# Patient Record
Sex: Male | Born: 1965
Health system: Southern US, Community
[De-identification: ages and names within clinical notes are randomized; demographics above are authoritative.]

## PROBLEM LIST (undated history)

## (undated) ENCOUNTER — Ambulatory Visit (HOSPITAL_COMMUNITY): Admission: EM | Disposition: A | Discharge status: Other Acute Inpatient Hospital | Payer: 59

## (undated) DIAGNOSIS — G2581 Restless legs syndrome: Secondary | ICD-10-CM

## (undated) DIAGNOSIS — K219 Gastro-esophageal reflux disease without esophagitis: Secondary | ICD-10-CM

## (undated) DIAGNOSIS — T7840XA Allergy, unspecified, initial encounter: Secondary | ICD-10-CM

## (undated) HISTORY — DX: Restless legs syndrome: G25.81

## (undated) HISTORY — PX: KNEE ARTHROSCOPY: SUR90

## (undated) HISTORY — PX: KNEE SURGERY: SHX244

## (undated) HISTORY — DX: Allergy, unspecified, initial encounter: T78.40XA

## (undated) HISTORY — DX: Gastro-esophageal reflux disease without esophagitis: K21.9

---

## 2013-12-30 ENCOUNTER — Ambulatory Visit: Payer: Self-pay | Admitting: Nurse Practitioner

## 2014-03-25 ENCOUNTER — Encounter (HOSPITAL_COMMUNITY): Payer: Self-pay | Admitting: Emergency Medicine

## 2014-03-25 ENCOUNTER — Emergency Department (HOSPITAL_COMMUNITY)
Admission: EM | Admit: 2014-03-25 | Discharge: 2014-03-25 | Disposition: A | Discharge status: Home / Self Care | Payer: 59 | Source: Home / Self Care | Attending: Emergency Medicine | Admitting: Emergency Medicine

## 2014-03-25 DIAGNOSIS — J0140 Acute pansinusitis, unspecified: Secondary | ICD-10-CM

## 2014-03-25 MED ORDER — AZITHROMYCIN 250 MG PO TABS: ORAL_TABLET | ORAL | Status: DC

## 2014-03-25 NOTE — ED Provider Notes (Signed)
CSN: [id number]     Arrival date & time 03/25/14  1129 History   First MD Initiated Contact with Patient 03/25/14 1142     Chief Complaint  Patient presents with  . Facial Pain   (Consider location/radiation/quality/duration/timing/severity/associated sxs/prior Treatment) HPI He is a 48 year old man here for evaluation of sinus congestion. He reports sinus congestion, rhinorrhea, postnasal drip for the last 2-3 weeks. It has been gradually worsening. He does report some subjective fevers and chills. No nausea or vomiting. He is eating and drinking well. He has a mild cough that is occasionally productive of green sputum. He also reports a stuffy feeling in his ears. He has tried multiple over-the-counter medications with no improvement.  This started after driving here from [place].  History reviewed. No pertinent past medical history. Past Surgical History  Procedure Laterality Date  . Knee surgery     History reviewed. No pertinent family history. History  Substance Use Topics  . Smoking status: Current Every Day Smoker -- 0.50 packs/day    Types: Cigarettes  . Smokeless tobacco: Not on file  . Alcohol Use: Yes    Review of Systems  Constitutional: Positive for fever (subjective) and chills. Negative for activity change and appetite change.  HENT: Positive for congestion, ear pain, postnasal drip, rhinorrhea and sinus pressure. Negative for sore throat and trouble swallowing.   Respiratory: Positive for cough. Negative for shortness of breath.   Cardiovascular: Negative.   Gastrointestinal: Negative.     Allergies  Penicillins  Home Medications   Prior to Admission medications   Medication Sig Start Date End Date Taking? Authorizing Provider  cyclobenzaprine (FLEXERIL) 10 MG tablet Take 10 mg by mouth 3 (three) times daily as needed for muscle spasms.   Yes Historical Provider, MD  traMADol (ULTRAM) 50 MG tablet Take by mouth every 6 (six) hours as needed.   Yes Historical  Provider, MD  azithromycin (ZITHROMAX Z-PAK) 250 MG tablet Take 2 tablets on day 1, then 1 pill daily until gone. 03/25/14   Melony Overly, MD   BP 127/91  Pulse 96  Temp(Src) 98.7 F (37.1 C) (Oral)  Resp 16  SpO2 98% Physical Exam  Constitutional: He is oriented to person, place, and time. He appears well-developed and well-nourished. No distress.  HENT:  Head: Normocephalic and atraumatic.  Right Ear: Tympanic membrane and external ear normal.  Left Ear: Tympanic membrane and external ear normal.  Nose: Mucosal edema and rhinorrhea present. Right sinus exhibits maxillary sinus tenderness and frontal sinus tenderness. Left sinus exhibits maxillary sinus tenderness and frontal sinus tenderness.  Mouth/Throat: Mucous membranes are normal. No oropharyngeal exudate, posterior oropharyngeal edema or posterior oropharyngeal erythema.  Eyes: Conjunctivae are normal.  Neck: Neck supple.  Cardiovascular: Normal rate, regular rhythm and normal heart sounds.   No murmur heard. Pulmonary/Chest: Effort normal and breath sounds normal. No respiratory distress. He has no wheezes. He has no rales.  Lymphadenopathy:    He has no cervical adenopathy.  Neurological: He is alert and oriented to person, place, and time.  Skin: Skin is warm and dry. No rash noted.    ED Course  Procedures (including critical care time) Labs Review Labs Reviewed - No data to display  Imaging Review No results found.   MDM   1. Acute pansinusitis, recurrence not specified    History and exam consistent with pansinusitis. With the timing of history to [place], could consider histoplasmosis as an etiology, but his presentation would be unusual. Will treat  with azithromycin given that he is allergic to penicillin. Also recommended Afrin for 3 days and frequent nasal saline spray. Followup with primary care provider if not improved after antibiotics.    Melony Overly, MD 03/25/14 [phone]

## 2014-03-25 NOTE — Discharge Instructions (Signed)
Take the antibiotic as prescribed. Use Afrin nasal spray for the next 3 days ONLY!! Use nasal saline spray as often as you can. If your symptoms do not improve in the next 5 days, please see your regular doctor.   Sinusitis Sinusitis is redness, soreness, and puffiness (inflammation) of the air pockets in the bones of your face (sinuses). The redness, soreness, and puffiness can cause air and mucus to get trapped in your sinuses. This can allow germs to grow and cause an infection.  HOME CARE   Drink enough fluids to keep your pee (urine) clear or pale yellow.  Use a humidifier in your home.  Run a hot shower to create steam in the bathroom. Sit in the bathroom with the door closed. Breathe in the steam 3-4 times a day.  Put a warm, moist washcloth on your face 3-4 times a day, or as told by your doctor.  Use salt water sprays (saline sprays) to wet the thick fluid in your nose. This can help the sinuses drain.  Only take medicine as told by your doctor. GET HELP RIGHT AWAY IF:   Your pain gets worse.  You have very bad headaches.  You are sick to your stomach (nauseous).  You throw up (vomit).  You are very sleepy (drowsy) all the time.  Your face is puffy (swollen).  Your vision changes.  You have a stiff neck.  You have trouble breathing. MAKE SURE YOU:   Understand these instructions.  Will watch your condition.  Will get help right away if you are not doing well or get worse. Document Released: 11/19/2007 Document Revised: 02/25/2012 Document Reviewed: 01/06/2012 [facility] Patient Information 2015 [place], [place]. This information is not intended to replace advice given to you by your health care provider. Make sure you discuss any questions you have with your health care provider.

## 2014-03-25 NOTE — ED Notes (Signed)
C/o  Congestion.  Productive cough with light green sputum.  Low grade temp.  Runny nose.  Sneezing.  And scratchy throat.  Symptoms present x 2 wks.  No relief with otc meds.

## 2015-03-16 ENCOUNTER — Emergency Department (HOSPITAL_COMMUNITY)
Admission: EM | Admit: 2015-03-16 | Discharge: 2015-03-16 | Disposition: A | Discharge status: Home / Self Care | Payer: 59 | Source: Home / Self Care | Attending: Family Medicine | Admitting: Family Medicine

## 2015-03-16 ENCOUNTER — Encounter (HOSPITAL_COMMUNITY): Payer: Self-pay | Admitting: Emergency Medicine

## 2015-03-16 DIAGNOSIS — J302 Other seasonal allergic rhinitis: Secondary | ICD-10-CM

## 2015-03-16 NOTE — Discharge Instructions (Signed)
Allergic Rhinitis vs URI Obtain Claritin-D or Allegra-D over-the-counter for congestion and drainage. For cough may consider Robitussin-DM. Tylenol or Advil for discomfort. Flonase or Rhinocort nasal spray as directed these are over-the-counter. Drink lots of fluids and stay well-hydrated. Allergic rhinitis is when the mucous membranes in the nose respond to allergens. Allergens are particles in the air that cause your body to have an allergic reaction. This causes you to release allergic antibodies. Through a chain of events, these eventually cause you to release histamine into the blood stream. Although meant to protect the body, it is this release of histamine that causes your discomfort, such as frequent sneezing, congestion, and an itchy, runny nose.  CAUSES  Seasonal allergic rhinitis (hay fever) is caused by pollen allergens that may come from grasses, trees, and weeds. Year-round allergic rhinitis (perennial allergic rhinitis) is caused by allergens such as house dust mites, pet dander, and mold spores.  SYMPTOMS   Nasal stuffiness (congestion).  Itchy, runny nose with sneezing and tearing of the eyes. DIAGNOSIS  Your health care provider can help you determine the allergen or allergens that trigger your symptoms. If you and your health care provider are unable to determine the allergen, skin or blood testing may be used. TREATMENT  Allergic rhinitis does not have a cure, but it can be controlled by:  Medicines and allergy shots (immunotherapy).  Avoiding the allergen. Hay fever may often be treated with antihistamines in pill or nasal spray forms. Antihistamines block the effects of histamine. There are over-the-counter medicines that may help with nasal congestion and swelling around the eyes. Check with your health care provider before taking or giving this medicine.  If avoiding the allergen or the medicine prescribed do not work, there are many new medicines your health care  provider can prescribe. Stronger medicine may be used if initial measures are ineffective. Desensitizing injections can be used if medicine and avoidance does not work. Desensitization is when a patient is given ongoing shots until the body becomes less sensitive to the allergen. Make sure you follow up with your health care provider if problems continue. HOME CARE INSTRUCTIONS It is not possible to completely avoid allergens, but you can reduce your symptoms by taking steps to limit your exposure to them. It helps to know exactly what you are allergic to so that you can avoid your specific triggers. SEEK MEDICAL CARE IF:   You have a fever.  You develop a cough that does not stop easily (persistent).  You have shortness of breath.  You start wheezing.  Symptoms interfere with normal daily activities. Document Released: 02/25/2001 Document Revised: 06/07/2013 Document Reviewed: 02/07/2013 [facility] Patient Information 2015 [place], [place]. This information is not intended to replace advice given to you by your health care provider. Make sure you discuss any questions you have with your health care provider.  Upper Respiratory Infection, Adult An upper respiratory infection (URI) is also known as the common cold. It is often caused by a type of germ (virus). Colds are easily spread (contagious). You can pass it to others by kissing, coughing, sneezing, or drinking out of the same glass. Usually, you get better in 1 or 2 weeks.  HOME CARE   Only take medicine as told by your doctor.  Use a warm mist humidifier or breathe in steam from a hot shower.  Drink enough water and fluids to keep your pee (urine) clear or pale yellow.  Get plenty of rest.  Return to work when your  temperature is back to normal or as told by your doctor. You may use a face mask and wash your hands to stop your cold from spreading. GET HELP RIGHT AWAY IF:   After the first few days, you feel you are getting  worse.  You have questions about your medicine.  You have chills, shortness of breath, or brown or red spit (mucus).  You have yellow or brown snot (nasal discharge) or pain in the face, especially when you bend forward.  You have a fever, puffy (swollen) neck, pain when you swallow, or white spots in the back of your throat.  You have a bad headache, ear pain, sinus pain, or chest pain.  You have a high-pitched whistling sound when you breathe in and out (wheezing).  You have a lasting cough or cough up blood.  You have sore muscles or a stiff neck. MAKE SURE YOU:   Understand these instructions.  Will watch your condition.  Will get help right away if you are not doing well or get worse. Document Released: 11/19/2007 Document Revised: 08/25/2011 Document Reviewed: 09/07/2013 [facility] Patient Information 2015 [place], [place]. This information is not intended to replace advice given to you by your health care provider. Make sure you discuss any questions you have with your health care provider.

## 2015-03-16 NOTE — ED Notes (Signed)
C/o cold sx onset Monday Sx include facial pressure, fevers, BA, HA, runny nose, and a dry cough Denies fevers, chills Son is being seen for similar sx A&O x4... No acute distress.

## 2015-03-16 NOTE — ED Provider Notes (Signed)
CSN: [id number]     Arrival date & time 03/16/15  1637 History   First MD Initiated Contact with Patient 03/16/15 1712     Chief Complaint  Patient presents with  . URI   (Consider location/radiation/quality/duration/timing/severity/associated sxs/prior Treatment) HPI Comments: 49 year old male with a history of allergy states that he is having body aches, runny nose particularly in the morning, intermittent headaches, and feeling hot and sweaty. Denies measured fevers. States he feels similar to what his allergies feel like on a yearly basis.   History reviewed. No pertinent past medical history. Past Surgical History  Procedure Laterality Date  . Knee surgery     No family history on file. Social History  Substance Use Topics  . Smoking status: Current Every Day Smoker -- 0.50 packs/day    Types: Cigarettes  . Smokeless tobacco: None  . Alcohol Use: Yes    Review of Systems  Constitutional: Positive for activity change. Negative for fever, diaphoresis and fatigue.  HENT: Positive for congestion, postnasal drip and rhinorrhea. Negative for ear pain, facial swelling, sore throat and trouble swallowing.   Eyes: Negative for pain, discharge and redness.  Respiratory: Positive for cough. Negative for chest tightness and shortness of breath.   Cardiovascular: Negative.   Gastrointestinal: Negative.   Musculoskeletal: Negative.  Negative for neck pain and neck stiffness.  Neurological: Negative.   All other systems reviewed and are negative.   Allergies  Penicillins  Home Medications   Prior to Admission medications   Medication Sig Start Date End Date Taking? Authorizing Provider  cyclobenzaprine (FLEXERIL) 10 MG tablet Take 10 mg by mouth 3 (three) times daily as needed for muscle spasms.   Yes Historical Provider, MD  traMADol (ULTRAM) 50 MG tablet Take by mouth every 6 (six) hours as needed.   Yes Historical Provider, MD  azithromycin (ZITHROMAX Z-PAK) 250 MG tablet Take  2 tablets on day 1, then 1 pill daily until gone. 03/25/14   Melony Overly, MD   Meds Ordered and Administered this Visit  Medications - No data to display  BP 105/61 mmHg  Pulse 81  Temp(Src) 97.1 F (36.2 C) (Oral)  Resp 16  SpO2 98% No data found.   Physical Exam  Constitutional: He is oriented to person, place, and time. He appears well-developed and well-nourished. No distress.  HENT:  Mouth/Throat: No oropharyngeal exudate.  Bilateral TMs are translucent. No erythema or effusion. Positive for bilateral mild TM retraction. Oropharynx with minor erythema, cobblestoning and clear PND.  Eyes: Conjunctivae and EOM are normal.  Neck: Normal range of motion. Neck supple.  Cardiovascular: Normal rate, regular rhythm and normal heart sounds.   Pulmonary/Chest: Effort normal. No respiratory distress. He has no wheezes.  No expiratory wheezing. There are diminished breath sounds in the lower fields bilaterally. Prolonged expiratory phase. No  adventitious sounds.  Musculoskeletal: Normal range of motion. He exhibits no edema.  Lymphadenopathy:    He has no cervical adenopathy.  Neurological: He is alert and oriented to person, place, and time.  Skin: Skin is warm and dry. No rash noted.  Psychiatric: He has a normal mood and affect.  Nursing note and vitals reviewed.   ED Course  Procedures (including critical care time)  Labs Review Labs Reviewed - No data to display  Imaging Review No results found.   Visual Acuity Review  Right Eye Distance:   Left Eye Distance:   Bilateral Distance:    Right Eye Near:   Left Eye Near:  Bilateral Near:         MDM   1. Other seasonal allergic rhinitis   vs URI  Obtain Claritin-D or Allegra-D over-the-counter for congestion and drainage. For cough may consider Robitussin-DM. Tylenol or Advil for discomfort. Flonase or Rhinocort nasal spray as directed these are over-the-counter. Drink lots of fluids and stay  well-hydrated.      Janne Napoleon, NP 03/16/15 1803  Janne Napoleon, NP 03/16/15 [phone]

## 2015-06-21 DIAGNOSIS — E663 Overweight: Secondary | ICD-10-CM | POA: Diagnosis not present

## 2015-06-21 DIAGNOSIS — Z0000 Encounter for general adult medical examination without abnormal findings: Secondary | ICD-10-CM | POA: Diagnosis not present

## 2015-06-21 DIAGNOSIS — Z6829 Body mass index (BMI) 29.0-29.9, adult: Secondary | ICD-10-CM | POA: Diagnosis not present

## 2015-06-21 DIAGNOSIS — M1991 Primary osteoarthritis, unspecified site: Secondary | ICD-10-CM | POA: Diagnosis not present

## 2015-06-21 DIAGNOSIS — Z1389 Encounter for screening for other disorder: Secondary | ICD-10-CM | POA: Diagnosis not present

## 2015-12-19 DIAGNOSIS — Z79891 Long term (current) use of opiate analgesic: Secondary | ICD-10-CM | POA: Diagnosis not present

## 2015-12-19 DIAGNOSIS — G894 Chronic pain syndrome: Secondary | ICD-10-CM | POA: Diagnosis not present

## 2015-12-19 DIAGNOSIS — Z1389 Encounter for screening for other disorder: Secondary | ICD-10-CM | POA: Diagnosis not present

## 2015-12-19 DIAGNOSIS — M1991 Primary osteoarthritis, unspecified site: Secondary | ICD-10-CM | POA: Diagnosis not present

## 2015-12-19 DIAGNOSIS — Z6828 Body mass index (BMI) 28.0-28.9, adult: Secondary | ICD-10-CM | POA: Diagnosis not present

## 2016-06-25 ENCOUNTER — Other Ambulatory Visit: Payer: Self-pay | Admitting: Occupational Medicine

## 2016-06-25 ENCOUNTER — Ambulatory Visit: Payer: Self-pay

## 2016-06-25 DIAGNOSIS — M25562 Pain in left knee: Secondary | ICD-10-CM

## 2016-06-30 DIAGNOSIS — Z6832 Body mass index (BMI) 32.0-32.9, adult: Secondary | ICD-10-CM | POA: Diagnosis not present

## 2016-06-30 DIAGNOSIS — E6609 Other obesity due to excess calories: Secondary | ICD-10-CM | POA: Diagnosis not present

## 2016-06-30 DIAGNOSIS — G894 Chronic pain syndrome: Secondary | ICD-10-CM | POA: Diagnosis not present

## 2016-06-30 DIAGNOSIS — Z1389 Encounter for screening for other disorder: Secondary | ICD-10-CM | POA: Diagnosis not present

## 2016-07-16 MED FILL — metroNIDAZOLE 500 MG TABS: 500 | 1 days supply | Qty: 4 | Fill #0

## 2016-08-08 DIAGNOSIS — M9903 Segmental and somatic dysfunction of lumbar region: Secondary | ICD-10-CM | POA: Diagnosis not present

## 2016-08-08 DIAGNOSIS — M545 Low back pain: Secondary | ICD-10-CM | POA: Diagnosis not present

## 2016-08-08 DIAGNOSIS — M9902 Segmental and somatic dysfunction of thoracic region: Secondary | ICD-10-CM | POA: Diagnosis not present

## 2016-08-08 DIAGNOSIS — M9905 Segmental and somatic dysfunction of pelvic region: Secondary | ICD-10-CM | POA: Diagnosis not present

## 2016-08-22 DIAGNOSIS — M545 Low back pain: Secondary | ICD-10-CM | POA: Diagnosis not present

## 2016-08-22 DIAGNOSIS — M9903 Segmental and somatic dysfunction of lumbar region: Secondary | ICD-10-CM | POA: Diagnosis not present

## 2016-08-22 DIAGNOSIS — M9902 Segmental and somatic dysfunction of thoracic region: Secondary | ICD-10-CM | POA: Diagnosis not present

## 2016-08-22 DIAGNOSIS — M9905 Segmental and somatic dysfunction of pelvic region: Secondary | ICD-10-CM | POA: Diagnosis not present

## 2016-09-16 MED FILL — DOXYCYCLINE HYCLATE 100 MG: 100 | 5 days supply | Qty: 10 | Fill #0

## 2016-09-16 MED FILL — HYDROCODON-APAP 5-325: 5-325 | 5 days supply | Qty: 60 | Fill #0

## 2016-09-26 DIAGNOSIS — G894 Chronic pain syndrome: Secondary | ICD-10-CM | POA: Diagnosis not present

## 2016-09-26 DIAGNOSIS — E6609 Other obesity due to excess calories: Secondary | ICD-10-CM | POA: Diagnosis not present

## 2016-09-26 DIAGNOSIS — Z6830 Body mass index (BMI) 30.0-30.9, adult: Secondary | ICD-10-CM | POA: Diagnosis not present

## 2016-09-26 DIAGNOSIS — Z1389 Encounter for screening for other disorder: Secondary | ICD-10-CM | POA: Diagnosis not present

## 2016-09-26 DIAGNOSIS — E782 Mixed hyperlipidemia: Secondary | ICD-10-CM | POA: Diagnosis not present

## 2016-12-04 DIAGNOSIS — Z113 Encounter for screening for infections with a predominantly sexual mode of transmission: Secondary | ICD-10-CM | POA: Diagnosis not present

## 2016-12-04 DIAGNOSIS — Z6831 Body mass index (BMI) 31.0-31.9, adult: Secondary | ICD-10-CM | POA: Diagnosis not present

## 2016-12-04 DIAGNOSIS — Z1389 Encounter for screening for other disorder: Secondary | ICD-10-CM | POA: Diagnosis not present

## 2016-12-04 DIAGNOSIS — M1711 Unilateral primary osteoarthritis, right knee: Secondary | ICD-10-CM | POA: Diagnosis not present

## 2017-01-24 ENCOUNTER — Ambulatory Visit (INDEPENDENT_AMBULATORY_CARE_PROVIDER_SITE_OTHER): Payer: 59 | Admitting: Urgent Care

## 2017-01-24 ENCOUNTER — Encounter: Payer: Self-pay | Admitting: Urgent Care

## 2017-01-24 VITALS — BP 110/80 | HR 81 | Temp 97.6°F | Resp 16 | Ht 69.5 in | Wt 217.6 lb

## 2017-01-24 DIAGNOSIS — Z9889 Other specified postprocedural states: Secondary | ICD-10-CM | POA: Diagnosis not present

## 2017-01-24 DIAGNOSIS — F17200 Nicotine dependence, unspecified, uncomplicated: Secondary | ICD-10-CM

## 2017-01-24 DIAGNOSIS — G2581 Restless legs syndrome: Secondary | ICD-10-CM

## 2017-01-24 MED ORDER — CYCLOBENZAPRINE HCL 10 MG PO TABS: 10.0000 mg | ORAL_TABLET | Freq: Every day | ORAL | 5 refills | Status: DC

## 2017-01-24 NOTE — Patient Instructions (Addendum)
Restless Legs Syndrome Restless legs syndrome is a condition that causes uncomfortable feelings or sensations in the legs, especially while sitting or lying down. The sensations usually cause an overwhelming urge to move the legs. The arms can also sometimes be affected. The condition can range from mild to severe. The symptoms often interfere with a person's ability to sleep. What are the causes? The cause of this condition is not known. What increases the risk? This condition is more likely to develop in:  People who are older than age 10.  Pregnant women. In general, restless legs syndrome is more common in women than in men.  People who have a family history of the condition.  People who have certain medical conditions, such as iron deficiency, kidney disease, Parkinson disease, or nerve damage.  People who take certain medicines, such as medicines for high blood pressure, nausea, colds, allergies, depression, and some heart conditions.  What are the signs or symptoms? The main symptom of this condition is uncomfortable sensations in the legs. These sensations may be:  Described as pulling, tingling, prickling, throbbing, crawling, or burning.  Worse while you are sitting or lying down.  Worse during periods of rest or inactivity.  Worse at night, often interfering with your sleep.  Accompanied by a very strong urge to move your legs.  Temporarily relieved by movement of your legs.  The sensations usually affect both sides of the body. The arms can also be affected, but this is rare. People who have this condition often have tiredness during the day because of their lack of sleep at night. How is this diagnosed? This condition may be diagnosed based on your description of the symptoms. You may also have tests, including blood tests, to check for other conditions that may lead to your symptoms. In some cases, you may be asked to spend some time in a sleep lab so your sleeping  can be monitored. How is this treated? Treatment for this condition is focused on managing the symptoms. Treatment may include:  Self-help and lifestyle changes.  Medicines.  Follow these instructions at home:  Take medicines only as directed by your health care provider.  Try these methods to get temporary relief from the uncomfortable sensations: ? Massage your legs. ? Walk or stretch. ? Take a cold or hot bath.  Practice good sleep habits. For example, go to bed and get up at the same time every day.  Exercise regularly.  Practice ways of relaxing, such as yoga or meditation.  Avoid caffeine and alcohol.  Do not use any tobacco products, including cigarettes, chewing tobacco, or electronic cigarettes. If you need help quitting, ask your health care provider.  Keep all follow-up visits as directed by your health care provider. This is important. Contact a health care provider if: Your symptoms do not improve with treatment, or they get worse. This information is not intended to replace advice given to you by your health care provider. Make sure you discuss any questions you have with your health care provider. Document Released: 05/23/2002 Document Revised: 11/08/2015 Document Reviewed: 05/29/2014 Elsevier Interactive Patient Education  2018 [employer].    Cyclobenzaprine tablets What is this medicine? CYCLOBENZAPRINE (sye kloe BEN za preen) is a muscle relaxer. It is used to treat muscle pain, spasms, and stiffness. This medicine may be used for other purposes; ask your health care provider or pharmacist if you have questions. COMMON BRAND NAME(S): Fexmid, Flexeril What should I tell my health care provider before  I take this medicine? They need to know if you have any of these conditions: -heart disease, irregular heartbeat, or previous heart attack -liver disease -thyroid problem -an unusual or allergic reaction to cyclobenzaprine, tricyclic antidepressants,  lactose, other medicines, foods, dyes, or preservatives -pregnant or trying to get pregnant -breast-feeding How should I use this medicine? Take this medicine by mouth with a glass of water. Follow the directions on the prescription label. If this medicine upsets your stomach, take it with food or milk. Take your medicine at regular intervals. Do not take it more often than directed. Talk to your pediatrician regarding the use of this medicine in children. Special care may be needed. Overdosage: If you think you have taken too much of this medicine contact a poison control center or emergency room at once. NOTE: This medicine is only for you. Do not share this medicine with others. What if I miss a dose? If you miss a dose, take it as soon as you can. If it is almost time for your next dose, take only that dose. Do not take double or extra doses. What may interact with this medicine? Do not take this medicine with any of the following medications: -certain medicines for fungal infections like fluconazole, itraconazole, ketoconazole, posaconazole, voriconazole -cisapride -dofetilide -dronedarone -halofantrine -levomethadyl -MAOIs like Carbex, Eldepryl, Marplan, Nardil, and Parnate -narcotic medicines for cough -pimozide -thioridazine -ziprasidone This medicine may also interact with the following medications: -alcohol -antihistamines for allergy, cough and cold -certain medicines for anxiety or sleep -certain medicines for cancer -certain medicines for depression like amitriptyline, fluoxetine, sertraline -certain medicines for infection like alfuzosin, chloroquine, clarithromycin, levofloxacin, mefloquine, pentamidine, troleandomycin -certain medicines for irregular heart beat -certain medicines for seizures like phenobarbital, primidone -contrast dyes -general anesthetics like halothane, isoflurane, methoxyflurane, propofol -local anesthetics like lidocaine, pramoxine,  tetracaine -medicines that relax muscles for surgery -narcotic medicines for pain -other medicines that prolong the QT interval (cause an abnormal heart rhythm) -phenothiazines like chlorpromazine, mesoridazine, prochlorperazine This list may not describe all possible interactions. Give your health care provider a list of all the medicines, herbs, non-prescription drugs, or dietary supplements you use. Also tell them if you smoke, drink alcohol, or use illegal drugs. Some items may interact with your medicine. What should I watch for while using this medicine? Tell your doctor or health care professional if your symptoms do not start to get better or if they get worse. You may get drowsy or dizzy. Do not drive, use machinery, or do anything that needs mental alertness until you know how this medicine affects you. Do not stand or sit up quickly, especially if you are an older patient. This reduces the risk of dizzy or fainting spells. Alcohol may interfere with the effect of this medicine. Avoid alcoholic drinks. If you are taking another medicine that also causes drowsiness, you may have more side effects. Give your health care provider a list of all medicines you use. Your doctor will tell you how much medicine to take. Do not take more medicine than directed. Call emergency for help if you have problems breathing or unusual sleepiness. Your mouth may get dry. Chewing sugarless gum or sucking hard candy, and drinking plenty of water may help. Contact your doctor if the problem does not go away or is severe. What side effects may I notice from receiving this medicine? Side effects that you should report to your doctor or health care professional as soon as possible: -allergic reactions like skin rash, itching  or hives, swelling of the face, lips, or tongue -breathing problems -chest pain -fast, irregular heartbeat -hallucinations -seizures -unusually weak or tired Side effects that usually do not  require medical attention (report to your doctor or health care professional if they continue or are bothersome): -headache -nausea, vomiting This list may not describe all possible side effects. Call your doctor for medical advice about side effects. You may report side effects to FDA at 1-800-FDA-1088. Where should I keep my medicine? Keep out of the reach of children. Store at room temperature between 15 and 30 degrees C (59 and 86 degrees F). Keep container tightly closed. Throw away any unused medicine after the expiration date. NOTE: This sheet is a summary. It may not cover all possible information. If you have questions about this medicine, talk to your doctor, pharmacist, or health care provider.  2018 Elsevier/Gold Standard (2015-03-13 12:05:46)     Health Risks of Smoking Smoking cigarettes is very bad for your health. Tobacco smoke has over 200 known poisons in it. It contains the poisonous gases nitrogen oxide and carbon monoxide. There are over 60 chemicals in tobacco smoke that cause cancer. Smoking is difficult to quit because a chemical in tobacco, called nicotine, causes addiction or dependence. When you smoke and inhale, nicotine is absorbed rapidly into the bloodstream through your lungs. Both inhaled and non-inhaled nicotine may be addictive. What are the risks of cigarette smoke? Cigarette smokers have an increased risk of many serious medical problems, including:  Lung cancer.  Lung disease, such as pneumonia, bronchitis, and emphysema.  Chest pain (angina) and heart attack because the heart is not getting enough oxygen.  Heart disease and peripheral blood vessel disease.  High blood pressure (hypertension).  Stroke.  Oral cancer, including cancer of the lip, mouth, or voice box.  Bladder cancer.  Pancreatic cancer.  Cervical cancer.  Pregnancy complications, including premature birth.  Stillbirths and smaller newborn babies, birth defects, and genetic  damage to sperm.  Early menopause.  Lower estrogen level for women.  Infertility.  Facial wrinkles.  Blindness.  Increased risk of broken bones (fractures).  Senile dementia.  Stomach ulcers and internal bleeding.  Delayed wound healing and increased risk of complications during surgery.  Even smoking lightly shortens your life expectancy by several years.  Because of secondhand smoke exposure, children of smokers have an increased risk of the following:  Sudden infant death syndrome (SIDS).  Respiratory infections.  Lung cancer.  Heart disease.  Ear infections.  What are the benefits of quitting? There are many health benefits of quitting smoking. Here are some of them:  Within days of quitting smoking, your risk of having a heart attack decreases, your blood flow improves, and your lung capacity improves. Blood pressure, pulse rate, and breathing patterns start returning to normal soon after quitting.  Within months, your lungs may clear up completely.  Quitting for 10 years reduces your risk of developing lung cancer and heart disease to almost that of a nonsmoker.  People who quit may see an improvement in their overall quality of life.  How do I quit smoking? Smoking is an addiction with both physical and psychological effects, and longtime habits can be hard to change. Your health care provider can recommend:  Programs and community resources, which may include group support, education, or talk therapy.  Prescription medicines to help reduce cravings.  Nicotine replacement products, such as patches, gum, and nasal sprays. Use these products only as directed. Do not replace cigarette  smoking with electronic cigarettes, which are commonly called e-cigarettes. The safety of e-cigarettes is not known, and some may contain harmful chemicals.  A combination of two or more of these methods.  Where to find more information:  American Lung Association:  www.lung.org  American Cancer Society: www.cancer.org Summary  Smoking cigarettes is very bad for your health. Cigarette smokers have an increased risk of many serious medical problems, including several cancers, heart disease, and stroke.  Smoking is an addiction with both physical and psychological effects, and longtime habits can be hard to change.  By stopping right away, you can greatly reduce the risk of medical problems for you and your family.  To help you quit smoking, your health care provider can recommend programs, community resources, prescription medicines, and nicotine replacement products such as patches, gum, and nasal sprays. This information is not intended to replace advice given to you by your health care provider. Make sure you discuss any questions you have with your health care provider. Document Released: 07/10/2004 Document Revised: 06/06/2016 Document Reviewed: 06/06/2016 Elsevier Interactive Patient Education  2017 [employer].   IF you received an x-ray today, you will receive an invoice from [facility]. Please contact [facility] at [phone] with questions or concerns regarding your invoice.   IF you received labwork today, you will receive an invoice from [facility]. Please contact [facility] at [phone] with questions or concerns regarding your invoice.   Our billing staff will not be able to assist you with questions regarding bills from these companies.  You will be contacted with the lab results as soon as they are available. The fastest way to get your results is to activate your My Chart account. Instructions are located on the last page of this paperwork. If you have not heard from [place] regarding the results in 2 weeks, please contact this office.

## 2017-01-24 NOTE — Progress Notes (Signed)
  MRN: [id number] DOB: 12-Nov-1965  Subjective:   Tyler Mack is a 51 y.o. male presenting for chief complaint of Leg Problem (restless leg syndrome bilateral  ) and Medication Refill (muscle relaxer)  Restless legs - Has a history of multiple surgeries to both knees. Has difficulty with restless legs worse at night, interrupts his sleep. Uses Flexeril for this with very good results. Usually takes 10mg  but on occasion uses 20mg  nightly. Sees Dr. Theda Sers with ortho, has follow up on the 22nd of August. Patient had blood draw done through [facility] about 1 month, did not have any abnormal findings. Smokes 1ppd, has tried to quit twice with nicotine gum, patches. Has also tried Chantix but failed this therapy.  Rhylen has a current medication list which includes the following prescription(s): cetirizine, cyclobenzaprine, and tramadol. Also is allergic to penicillins.  Tyler Mack  has a past medical history of Allergy; GERD (gastroesophageal reflux disease); and Restless legs syndrome. Also  has a past surgical history that includes Knee surgery. His family history includes Alcohol abuse in his brother, maternal aunt, and maternal uncle; Asthma in his son; Cancer in his maternal aunt and maternal uncle; Diabetes in his mother; Drug abuse in his brother; Heart disease in his brother; Hyperlipidemia in his brother; Hypertension in his brother; Mental illness in his brother; Stomach cancer in his father.   Objective:   Vitals: BP 110/80 (BP Location: Left Arm, Patient Position: Sitting, Cuff Size: Large)   Pulse 81   Temp 97.6 F (36.4 C) (Oral)   Resp 16   Ht 5' 9.5" (1.765 m)   Wt 217 lb 9.6 oz (98.7 kg)   SpO2 97%   BMI 31.67 kg/m   Physical Exam  Constitutional: He is oriented to person, place, and time. He appears well-developed and well-nourished.  Cardiovascular: Normal rate.   Pulmonary/Chest: Effort normal.  Neurological: He is alert and oriented to person, place, and time.  Psychiatric: He  has a normal mood and affect.   Assessment and Plan :   1. Restless legs syndrome 2. History of left knee surgery 3. History of right knee surgery - Stable, does very well with Flexeril. Counseled patient on potential for adverse effects with medications prescribed today, patient verbalized understanding. Follow up in 6 months.  4. Tobacco use disorder - Counseled on smoking cessation, patient will consider trying Wellbutrin if he becomes interested in quitting.  Jaynee Eagles, PA-C [facility] [phone] 01/24/2017  9:40 AM

## 2017-02-04 ENCOUNTER — Encounter: Payer: Self-pay | Admitting: Family Medicine

## 2017-02-04 ENCOUNTER — Ambulatory Visit (INDEPENDENT_AMBULATORY_CARE_PROVIDER_SITE_OTHER): Payer: 59 | Admitting: Family Medicine

## 2017-02-04 VITALS — BP 132/82 | Ht 69.5 in | Wt 218.8 lb

## 2017-02-04 DIAGNOSIS — R5383 Other fatigue: Secondary | ICD-10-CM | POA: Diagnosis not present

## 2017-02-04 MED ORDER — METRONIDAZOLE 500 MG PO TABS: 500.0000 mg | ORAL_TABLET | Freq: Two times a day (BID) | ORAL | 0 refills | Status: DC

## 2017-02-04 NOTE — Progress Notes (Signed)
   Subjective:    Patient ID: Tyler Mack, male    DOB: May 01, 1966, 51 y.o.   MRN: [id number] Patient presents as a new patient with substantial fatigue and tiredness over the past couple years. HPI Patient arrives ti discuss testosterone levels. Patient states he is having low energy level-tried coffee  and energy drinks and they do not help so he is wondering if it is related to testosterone level.  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  Smokes ppd  No alcohol itake  occas [place] just on ccasiion   Pt was on cholesterol meds, fam hx of this, hereditary  Reg chol norm al   Gets enough sleep 10 thirty til five thirty  Sleeps well, no hx of major smoring  Walks five mi l per day, stays active   Hx of knee surg, takes flexeril   No colon c a no prost cancer      Review of Systems No headache, no major weight loss or weight gain, no chest pain no back pain abdominal pain no change in bowel habits complete ROS otherwise negative     Objective:   Physical Exam  Alert and oriented, vitals reviewed and stable, NAD ENT-TM's and ext canals WNL bilat via otoscopic exam Soft palate, tonsils and post pharynx WNL via oropharyngeal exam Neck-symmetric, no masses; thyroid nonpalpable and nontender Pulmonary-no tachypnea or accessory muscle use; Clear without wheezes via auscultation Card--no abnrml murmurs, rhythm reg and rate WNL Carotid pulses symmetric, without bruits       Assessment & Plan:  Impression 1 fatigue and tiredness progressive over a couple years. #2 chronic smoker no cough no wheezing no morning cough no tachypnea smoking cessation discussed #3 testosterone concerns. Patient's bodies and family has been tone this is most certainly  initially. Patient educated on many potential side effects become from testosterone initiation and the fact that we need very low numbers before justifying these side effects and risks plan appropriate blood work. Exercise diet discussed in encourage. Smoking cessation discussed. Next further recommendations based on blood work results  Greater than 50% of this 30 minute face to face visit was spent in counseling and discussion and coordination of care regarding the above diagnosis/diagnosies/nearly wellness exams encourage

## 2017-03-12 ENCOUNTER — Ambulatory Visit: Payer: 59 | Admitting: Physician Assistant

## 2017-03-14 ENCOUNTER — Ambulatory Visit: Payer: 59 | Admitting: Family Medicine

## 2017-03-14 ENCOUNTER — Encounter: Payer: Self-pay | Admitting: Urgent Care

## 2017-03-14 ENCOUNTER — Ambulatory Visit (INDEPENDENT_AMBULATORY_CARE_PROVIDER_SITE_OTHER): Payer: 59 | Admitting: Urgent Care

## 2017-03-14 VITALS — BP 130/90 | HR 100 | Temp 98.2°F | Resp 17 | Ht 69.5 in | Wt 217.0 lb

## 2017-03-14 DIAGNOSIS — Z9889 Other specified postprocedural states: Secondary | ICD-10-CM | POA: Diagnosis not present

## 2017-03-14 DIAGNOSIS — M25562 Pain in left knee: Secondary | ICD-10-CM

## 2017-03-14 DIAGNOSIS — G8929 Other chronic pain: Secondary | ICD-10-CM

## 2017-03-14 DIAGNOSIS — Z23 Encounter for immunization: Secondary | ICD-10-CM | POA: Diagnosis not present

## 2017-03-14 MED ORDER — MELOXICAM 7.5 MG PO TABS: 7.5000 mg | ORAL_TABLET | Freq: Every day | ORAL | 5 refills | Status: DC

## 2017-03-14 MED ORDER — TRAMADOL HCL 50 MG PO TABS: 50.0000 mg | ORAL_TABLET | Freq: Four times a day (QID) | ORAL | 5 refills | Status: DC | PRN

## 2017-03-14 MED ORDER — CYCLOBENZAPRINE HCL 10 MG PO TABS: 10.0000 mg | ORAL_TABLET | Freq: Every day | ORAL | 5 refills | Status: DC

## 2017-03-14 NOTE — Progress Notes (Signed)
   MRN: [id number] DOB: [birth year]  Subjective:   Tyler Mack is a 51 y.o. male presenting for chief complaint of Medication Refill (flexeril , ultram and meloxicam)  Reports longstanding history of chronic pain of left knee, intermittent swelling and redness. Has had knee arthroscopy done of left knee, MRIs performed. Admits that he is supposed to get a knee replacement but is being refused because of his age. For now, he is managing this pain through medical therapy. Uses meloxicam, Flexeril, Ultram daily. He has an orthopedist.  Tyler Mack has a current medication list which includes the following prescription(s): cyclobenzaprine and tramadol. Also is allergic to penicillins.  Tyler Mack  has a past medical history of Allergy; GERD (gastroesophageal reflux disease); and Restless legs syndrome. Also  has a past surgical history that includes Knee surgery and Knee arthroscopy.  Objective:   Vitals: BP 130/90 (BP Location: Right Arm, Patient Position: Sitting, Cuff Size: Normal)   Pulse 100   Temp 98.2 F (36.8 C) (Oral)   Resp 17   Ht 5' 9.5" (1.765 m)   Wt 217 lb (98.4 kg)   SpO2 98%   BMI 31.59 kg/m   Physical Exam  Constitutional: He is oriented to person, place, and time. He appears well-developed and well-nourished.  Cardiovascular: Normal rate.   Pulmonary/Chest: Effort normal.  Musculoskeletal:       Left knee: He exhibits swelling (trace, medially) and bony tenderness. He exhibits normal range of motion, no ecchymosis, no deformity, no laceration, no erythema and normal patellar mobility. Tenderness found. Medial joint line, lateral joint line and patellar tendon tenderness noted.  Neurological: He is alert and oriented to person, place, and time.   Assessment and Plan :   1. Chronic pain of left knee 2. S/P left knee arthroscopy - Stable, continue f/u with ortho. Refilled meloxicam, Flexeril, tramadol. Counseled patient on potential for adverse effects with medications prescribed  today, patient verbalized understanding. Follow up in 1-6 months. No early refills on tramadol.  Tyler Eagles, PA-C [facility] [phone] 03/14/2017  9:14 AM

## 2017-03-14 NOTE — Patient Instructions (Addendum)
Knee Pain, Adult Knee pain in adults is common. It can be caused by many things, including:  Arthritis.  A fluid-filled sac (cyst) or growth in your knee.  An infection in your knee.  An injury that will not heal.  Damage, swelling, or irritation of the tissues that support your knee.  Knee pain is usually not a sign of a serious problem. The pain may go away on its own with time and rest. If it does not, a health care provider may order tests to find the cause of the pain. These may include:  Imaging tests, such as an X-ray, MRI, or ultrasound.  Joint aspiration. In this test, fluid is removed from the knee.  Arthroscopy. In this test, a lighted tube is inserted into knee and an image is projected onto a TV screen.  A biopsy. In this test, a sample of tissue is removed from the body and studied under a microscope.  Follow these instructions at home: Pay attention to any changes in your symptoms. Take these actions to relieve your pain. Activity  Rest your knee.  Do not do things that cause pain or make pain worse.  Avoid high-impact activities or exercises, such as running, jumping rope, or doing jumping jacks. General instructions  Take over-the-counter and prescription medicines only as told by your health care provider.  Raise (elevate) your knee above the level of your heart when you are sitting or lying down.  Sleep with a pillow under your knee.  If directed, apply ice to the knee: ? Put ice in a plastic bag. ? Place a towel between your skin and the bag. ? Leave the ice on for 20 minutes, 2-3 times a day.  Ask your health care provider if you should wear an elastic knee support.  Lose weight if you are overweight. Extra weight can put pressure on your knee.  Do not use any products that contain nicotine or tobacco, such as cigarettes and e-cigarettes. Smoking may slow the healing of any bone and joint problems that you may have. If you need help quitting, ask  your health care provider. Contact a health care provider if:  Your knee pain continues, changes, or gets worse.  You have a fever along with knee pain.  Your knee buckles or locks up.  Your knee swells, and the swelling becomes worse. Get help right away if:  Your knee feels warm to the touch.  You cannot move your knee.  You have severe pain in your knee.  You have chest pain.  You have trouble breathing. Summary  Knee pain in adults is common. It can be caused by many things, including, arthritis, infection, cysts, or injury.  Knee pain is usually not a sign of a serious problem, but if it does not go away, a health care provider may perform tests to know the cause of the pain.  Pay attention to any changes in your symptoms. Relieve your pain with rest, medicines, light activity, and use of ice.  Get help if your pain continues or becomes very severe, or if your knee buckles or locks up, or if you have chest pain or trouble breathing. This information is not intended to replace advice given to you by your health care provider. Make sure you discuss any questions you have with your health care provider. Document Released: 03/30/2007 Document Revised: 05/23/2016 Document Reviewed: 05/23/2016 Elsevier Interactive Patient Education  2018 [employer].    Tramadol tablets What is this medicine?  TRAMADOL (TRA ma dole) is a pain reliever. It is used to treat moderate to severe pain in adults. This medicine may be used for other purposes; ask your health care provider or pharmacist if you have questions. COMMON BRAND NAME(S): Ultram What should I tell my health care provider before I take this medicine? They need to know if you have any of these conditions: -brain tumor -depression -drug abuse or addiction -head injury -if you frequently drink alcohol containing drinks -kidney disease or trouble passing urine -liver disease -lung disease, asthma, or breathing  problems -seizures or epilepsy -suicidal thoughts, plans, or attempt; a previous suicide attempt by you or a family member -an unusual or allergic reaction to tramadol, codeine, other medicines, foods, dyes, or preservatives -pregnant or trying to get pregnant -breast-feeding How should I use this medicine? Take this medicine by mouth with a full glass of water. Follow the directions on the prescription label. You can take it with or without food. If it upsets your stomach, take it with food. Do not take your medicine more often than directed. A special MedGuide will be given to you by the pharmacist with each prescription and refill. Be sure to read this information carefully each time. Talk to your pediatrician regarding the use of this medicine in children. Special care may be needed. Overdosage: If you think you have taken too much of this medicine contact a poison control center or emergency room at once. NOTE: This medicine is only for you. Do not share this medicine with others. What if I miss a dose? If you miss a dose, take it as soon as you can. If it is almost time for your next dose, take only that dose. Do not take double or extra doses. What may interact with this medicine? Do not take this medication with any of the following medicines: -MAOIs like Carbex, Eldepryl, Marplan, Nardil, and Parnate This medicine may also interact with the following medications: -alcohol -antihistamines for allergy, cough and cold -certain medicines for anxiety or sleep -certain medicines for depression like amitriptyline, fluoxetine, sertraline -certain medicines for migraine headache like almotriptan, eletriptan, frovatriptan, naratriptan, rizatriptan, sumatriptan, zolmitriptan -certain medicines for seizures like carbamazepine, oxcarbazepine, phenobarbital, primidone -certain medicines that treat or prevent blood clots like warfarin -digoxin -furazolidone -general anesthetics like halothane,  isoflurane, methoxyflurane, propofol -linezolid -local anesthetics like lidocaine, pramoxine, tetracaine -medicines that relax muscles for surgery -other narcotic medicines for pain or cough -phenothiazines like chlorpromazine, mesoridazine, prochlorperazine, thioridazine -procarbazine This list may not describe all possible interactions. Give your health care provider a list of all the medicines, herbs, non-prescription drugs, or dietary supplements you use. Also tell them if you smoke, drink alcohol, or use illegal drugs. Some items may interact with your medicine. What should I watch for while using this medicine? Tell your doctor or health care professional if your pain does not go away, if it gets worse, or if you have new or a different type of pain. You may develop tolerance to the medicine. Tolerance means that you will need a higher dose of the medicine for pain relief. Tolerance is normal and is expected if you take this medicine for a long time. Do not suddenly stop taking your medicine because you may develop a severe reaction. Your body becomes used to the medicine. This does NOT mean you are addicted. Addiction is a behavior related to getting and using a drug for a non-medical reason. If you have pain, you have a medical reason to  take pain medicine. Your doctor will tell you how much medicine to take. If your doctor wants you to stop the medicine, the dose will be slowly lowered over time to avoid any side effects. There are different types of narcotic medicines (opiates). If you take more than one type at the same time or if you are taking another medicine that also causes drowsiness, you may have more side effects. Give your health care provider a list of all medicines you use. Your doctor will tell you how much medicine to take. Do not take more medicine than directed. Call emergency for help if you have problems breathing or unusual sleepiness. You may get drowsy or dizzy. Do not  drive, use machinery, or do anything that needs mental alertness until you know how this medicine affects you. Do not stand or sit up quickly, especially if you are an older patient. This reduces the risk of dizzy or fainting spells. Alcohol can increase or decrease the effects of this medicine. Avoid alcoholic drinks. You may have constipation. Try to have a bowel movement at least every 2 to 3 days. If you do not have a bowel movement for 3 days, call your doctor or health care professional. Your mouth may get dry. Chewing sugarless gum or sucking hard candy, and drinking plenty of water may help. Contact your doctor if the problem does not go away or is severe. What side effects may I notice from receiving this medicine? Side effects that you should report to your doctor or health care professional as soon as possible: -allergic reactions like skin rash, itching or hives, swelling of the face, lips, or tongue -breathing problems -confusion -seizures -signs and symptoms of low blood pressure like dizziness; feeling faint or lightheaded, falls; unusually weak or tired -trouble passing urine or change in the amount of urine Side effects that usually do not require medical attention (report to your doctor or health care professional if they continue or are bothersome): -constipation -dry mouth -nausea, vomiting -tiredness This list may not describe all possible side effects. Call your doctor for medical advice about side effects. You may report side effects to FDA at 1-800-FDA-1088. Where should I keep my medicine? Keep out of the reach of children. This medicine may cause accidental overdose and death if it taken by other adults, children, or pets. Mix any unused medicine with a substance like cat litter or coffee grounds. Then throw the medicine away in a sealed container like a sealed bag or a coffee can with a lid. Do not use the medicine after the expiration date. Store at room temperature  between 15 and 30 degrees C (59 and 86 degrees F). NOTE: This sheet is a summary. It may not cover all possible information. If you have questions about this medicine, talk to your doctor, pharmacist, or health care provider.  2018 Elsevier/Gold Standard (2015-02-25 09:00:04)     IF you received an x-ray today, you will receive an invoice from [facility]. Please contact [facility] at [phone] with questions or concerns regarding your invoice.   IF you received labwork today, you will receive an invoice from [facility]. Please contact [facility] at [phone] with questions or concerns regarding your invoice.   Our billing staff will not be able to assist you with questions regarding bills from these companies.  You will be contacted with the lab results as soon as they are available. The fastest way to get your results is to activate your My Chart account.  Instructions are located on the last page of this paperwork. If you have not heard from [place] regarding the results in 2 weeks, please contact this office.

## 2017-03-15 LAB — COMPREHENSIVE METABOLIC PANEL
ALT: 30 IU/L (ref 0–44)
AST: 22 IU/L (ref 0–40)
Albumin/Globulin Ratio: 1.4 (ref 1.2–2.2)
Albumin: 4.7 g/dL (ref 3.5–5.5)
Alkaline Phosphatase: 123 IU/L — ABNORMAL HIGH (ref 39–117)
BUN/Creatinine Ratio: 17 (ref 9–20)
BUN: 16 mg/dL (ref 6–24)
Bilirubin Total: 0.2 mg/dL (ref 0.0–1.2)
CALCIUM: 10.7 mg/dL — AB (ref 8.7–10.2)
CO2: 25 mmol/L (ref 20–29)
CREATININE: 0.94 mg/dL (ref 0.76–1.27)
Chloride: 99 mmol/L (ref 96–106)
GFR calc Af Amer: 110 mL/min/{1.73_m2} (ref >59)
GFR calc non Af Amer: 95 mL/min/{1.73_m2} (ref >59)
Globulin, Total: 3.3 g/dL (ref 1.5–4.5)
Glucose: 95 mg/dL (ref 65–99)
Potassium: 4.7 mmol/L (ref 3.5–5.2)
Sodium: 139 mmol/L (ref 134–144)
Total Protein: 8 g/dL (ref 6.0–8.5)

## 2017-03-16 ENCOUNTER — Encounter: Payer: Self-pay | Admitting: Urgent Care

## 2017-03-23 ENCOUNTER — Telehealth: Payer: Self-pay | Admitting: Urgent Care

## 2017-03-23 NOTE — Telephone Encounter (Signed)
PATIENT STATES HE SAW MARIO MANI ABOUT A WEEK AGO TO GET HIS MEDICATIONS REFILLED. WHEN HIS PRESCRIPTION FOR THE TRAMADOL 50 MG WAS WRITTEN IT SAID TAKE 1 TABLET EVERY 6 HOURS AS NEEDED. MR. Ma SAID THAT IS ONLY A 7 1/2 DAY SUPPLY. WITH HIS PREVIOUS DOCTOR HE TOOK 2 TABLETS BY MOUTH 4 TIMES DAILY. THAT IS 120 TABLETS PER MONTH. HE SAID HE BROUGHT IN HIS PRESCRIPTION BOTTLE AND SHOWED MARIO. HE HAS NOT GOTTEN THE ORIGINAL PRESCRIPTION FILLED YET. BEST PHONE [phone] (CELL) [place]

## 2017-03-24 DIAGNOSIS — Z6830 Body mass index (BMI) 30.0-30.9, adult: Secondary | ICD-10-CM | POA: Diagnosis not present

## 2017-03-24 DIAGNOSIS — Z79899 Other long term (current) drug therapy: Secondary | ICD-10-CM | POA: Diagnosis not present

## 2017-03-24 DIAGNOSIS — Z79891 Long term (current) use of opiate analgesic: Secondary | ICD-10-CM | POA: Diagnosis not present

## 2017-03-24 DIAGNOSIS — Z1389 Encounter for screening for other disorder: Secondary | ICD-10-CM | POA: Diagnosis not present

## 2017-03-24 DIAGNOSIS — E6609 Other obesity due to excess calories: Secondary | ICD-10-CM | POA: Diagnosis not present

## 2017-03-24 DIAGNOSIS — G894 Chronic pain syndrome: Secondary | ICD-10-CM | POA: Diagnosis not present

## 2017-03-25 NOTE — Telephone Encounter (Signed)
Please advise 

## 2017-03-26 NOTE — Telephone Encounter (Signed)
I tried calling the patient and was unable to reach him. I will not refill his Tramadol indefinitely. He needs to see his orthopedist for this. He reported at his office visit that he has been working with ortho and needs a knee replacement but there isn't any documentation or imaging supporting this. He is welcome to see another provider at our practice to discuss refills of tramadol but the most appropriate would be for him to get consistent f/u with his orthopedist.

## 2017-03-30 NOTE — Telephone Encounter (Signed)
Pt advised.

## 2017-04-22 DIAGNOSIS — S83242D Other tear of medial meniscus, current injury, left knee, subsequent encounter: Secondary | ICD-10-CM | POA: Diagnosis not present

## 2017-04-22 DIAGNOSIS — Z4789 Encounter for other orthopedic aftercare: Secondary | ICD-10-CM | POA: Diagnosis not present

## 2017-04-27 ENCOUNTER — Telehealth: Payer: Self-pay | Admitting: Urgent Care

## 2017-04-27 ENCOUNTER — Ambulatory Visit: Payer: 59 | Admitting: Urgent Care

## 2017-04-27 NOTE — Telephone Encounter (Signed)
Copied from [place] [id number]. Topic: Quick Communication - Appointment Cancellation >> Apr 27, 2017 12:24 PM Cecelia Byars, NT wrote: CRM for notification. See Telephone encounter for: Patient  04/27/17. Patient called to cancel appointment scheduled for today 2:00 pm this afternoon. Patient HAS: rescheduled their appointment. For 11/12 at 11  Route to department's PEC pool.

## 2017-04-28 ENCOUNTER — Encounter: Payer: Self-pay | Admitting: Urgent Care

## 2017-04-28 ENCOUNTER — Ambulatory Visit (INDEPENDENT_AMBULATORY_CARE_PROVIDER_SITE_OTHER): Payer: 59 | Admitting: Urgent Care

## 2017-04-28 VITALS — BP 122/72 | HR 116 | Temp 97.7°F | Resp 17 | Ht 70.5 in | Wt 220.0 lb

## 2017-04-28 DIAGNOSIS — J069 Acute upper respiratory infection, unspecified: Secondary | ICD-10-CM | POA: Diagnosis not present

## 2017-04-28 DIAGNOSIS — J029 Acute pharyngitis, unspecified: Secondary | ICD-10-CM | POA: Diagnosis not present

## 2017-04-28 DIAGNOSIS — B9789 Other viral agents as the cause of diseases classified elsewhere: Secondary | ICD-10-CM | POA: Diagnosis not present

## 2017-04-28 DIAGNOSIS — Z9109 Other allergy status, other than to drugs and biological substances: Secondary | ICD-10-CM

## 2017-04-28 DIAGNOSIS — F17200 Nicotine dependence, unspecified, uncomplicated: Secondary | ICD-10-CM | POA: Diagnosis not present

## 2017-04-28 MED ORDER — CETIRIZINE HCL 10 MG PO TABS: 10.0000 mg | ORAL_TABLET | Freq: Every day | ORAL | 11 refills | Status: DC

## 2017-04-28 MED ORDER — PSEUDOEPHEDRINE HCL 60 MG PO TABS: 60.0000 mg | ORAL_TABLET | Freq: Three times a day (TID) | ORAL | 0 refills | Status: DC | PRN

## 2017-04-28 MED ORDER — HYDROCODONE-HOMATROPINE 5-1.5 MG/5ML PO SYRP: 5.0000 mL | ORAL_SOLUTION | Freq: Every evening | ORAL | 0 refills | Status: DC | PRN

## 2017-04-28 MED ORDER — BENZONATATE 100 MG PO CAPS: 100.0000 mg | ORAL_CAPSULE | Freq: Three times a day (TID) | ORAL | 0 refills | Status: DC | PRN

## 2017-04-28 NOTE — Patient Instructions (Addendum)
Upper Respiratory Infection, Adult Most upper respiratory infections (URIs) are a viral infection of the air passages leading to the lungs. A URI affects the nose, throat, and upper air passages. The most common type of URI is nasopharyngitis and is typically referred to as "the common cold." URIs run their course and usually go away on their own. Most of the time, a URI does not require medical attention, but sometimes a bacterial infection in the upper airways can follow a viral infection. This is called a secondary infection. Sinus and middle ear infections are common types of secondary upper respiratory infections. Bacterial pneumonia can also complicate a URI. A URI can worsen asthma and chronic obstructive pulmonary disease (COPD). Sometimes, these complications can require emergency medical care and may be life threatening. What are the causes? Almost all URIs are caused by viruses. A virus is a type of germ and can spread from one person to another. What increases the risk? You may be at risk for a URI if:  You smoke.  You have chronic heart or lung disease.  You have a weakened defense (immune) system.  You are very young or very old.  You have nasal allergies or asthma.  You work in crowded or poorly ventilated areas.  You work in health care facilities or schools.  What are the signs or symptoms? Symptoms typically develop 2-3 days after you come in contact with a cold virus. Most viral URIs last 7-10 days. However, viral URIs from the influenza virus (flu virus) can last 14-18 days and are typically more severe. Symptoms may include:  Runny or stuffy (congested) nose.  Sneezing.  Cough.  Sore throat.  Headache.  Fatigue.  Fever.  Loss of appetite.  Pain in your forehead, behind your eyes, and over your cheekbones (sinus pain).  Muscle aches.  How is this diagnosed? Your health care provider may diagnose a URI by:  Physical exam.  Tests to check that your  symptoms are not due to another condition such as: ? Strep throat. ? Sinusitis. ? Pneumonia. ? Asthma.  How is this treated? A URI goes away on its own with time. It cannot be cured with medicines, but medicines may be prescribed or recommended to relieve symptoms. Medicines may help:  Reduce your fever.  Reduce your cough.  Relieve nasal congestion.  Follow these instructions at home:  Take medicines only as directed by your health care provider.  Gargle warm saltwater or take cough drops to comfort your throat as directed by your health care provider.  Use a warm mist humidifier or inhale steam from a shower to increase air moisture. This may make it easier to breathe.  Drink enough fluid to keep your urine clear or pale yellow.  Eat soups and other clear broths and maintain good nutrition.  Rest as needed.  Return to work when your temperature has returned to normal or as your health care provider advises. You may need to stay home longer to avoid infecting others. You can also use a face mask and careful hand washing to prevent spread of the virus.  Increase the usage of your inhaler if you have asthma.  Do not use any tobacco products, including cigarettes, chewing tobacco, or electronic cigarettes. If you need help quitting, ask your health care provider. How is this prevented? The best way to protect yourself from getting a cold is to practice good hygiene.  Avoid oral or hand contact with people with cold symptoms.  Wash your   hands often if contact occurs.  There is no clear evidence that vitamin C, vitamin E, echinacea, or exercise reduces the chance of developing a cold. However, it is always recommended to get plenty of rest, exercise, and practice good nutrition. Contact a health care provider if:  You are getting worse rather than better.  Your symptoms are not controlled by medicine.  You have chills.  You have worsening shortness of breath.  You have  brown or red mucus.  You have yellow or brown nasal discharge.  You have pain in your face, especially when you bend forward.  You have a fever.  You have swollen neck glands.  You have pain while swallowing.  You have white areas in the back of your throat. Get help right away if:  You have severe or persistent: ? Headache. ? Ear pain. ? Sinus pain. ? Chest pain.  You have chronic lung disease and any of the following: ? Wheezing. ? Prolonged cough. ? Coughing up blood. ? A change in your usual mucus.  You have a stiff neck.  You have changes in your: ? Vision. ? Hearing. ? Thinking. ? Mood. This information is not intended to replace advice given to you by your health care provider. Make sure you discuss any questions you have with your health care provider. Document Released: 11/26/2000 Document Revised: 02/03/2016 Document Reviewed: 09/07/2013 Elsevier Interactive Patient Education  2017 [employer].    Sore Throat When you have a sore throat, your throat may:  Hurt.  Burn.  Feel irritated.  Feel scratchy.  Many things can cause a sore throat, including:  An infection.  Allergies.  Dryness in the air.  Smoke or pollution.  Gastroesophageal reflux disease (GERD).  A tumor.  A sore throat can be the first sign of another sickness. It can happen with other problems, like coughing or a fever. Most sore throats go away without treatment. Follow these instructions at home:  Take over-the-counter medicines only as told by your doctor.  Drink enough fluids to keep your pee (urine) clear or pale yellow.  Rest when you feel you need to.  To help with pain, try: ? Sipping warm liquids, such as broth, herbal tea, or warm water. ? Eating or drinking cold or frozen liquids, such as frozen ice pops. ? Gargling with a salt-water mixture 3-4 times a day or as needed. To make a salt-water mixture, add -1 tsp of salt in 1 cup of warm water. Mix it  until you cannot see the salt anymore. ? Sucking on hard candy or throat lozenges. ? Putting a cool-mist humidifier in your bedroom at night. ? Sitting in the bathroom with the door closed for 5-10 minutes while you run hot water in the shower.  Do not use any tobacco products, such as cigarettes, chewing tobacco, and e-cigarettes. If you need help quitting, ask your doctor. Contact a doctor if:  You have a fever for more than 2-3 days.  You keep having symptoms for more than 2-3 days.  Your throat does not get better in 7 days.  You have a fever and your symptoms suddenly get worse. Get help right away if:  You have trouble breathing.  You cannot swallow fluids, soft foods, or your saliva.  You have swelling in your throat or neck that gets worse.  You keep feeling like you are going to throw up (vomit).  You keep throwing up. This information is not intended to replace advice given to  you by your health care provider. Make sure you discuss any questions you have with your health care provider. Document Released: 03/11/2008 Document Revised: 01/27/2016 Document Reviewed: 03/23/2015 Elsevier Interactive Patient Education  2018 [employer].     IF you received an x-ray today, you will receive an invoice from [facility]. Please contact [facility] at [phone] with questions or concerns regarding your invoice.   IF you received labwork today, you will receive an invoice from [facility]. Please contact [facility] at [phone] with questions or concerns regarding your invoice.   Our billing staff will not be able to assist you with questions regarding bills from these companies.  You will be contacted with the lab results as soon as they are available. The fastest way to get your results is to activate your My Chart account. Instructions are located on the last page of this paperwork. If you have not heard from [place] regarding the results in 2 weeks, please  contact this office.

## 2017-04-28 NOTE — Progress Notes (Addendum)
  MRN: [id number] DOB: 11-Mar-1966  Subjective:   Tyler Mack is a 51 y.o. male presenting for 2 day history of nasal congestion, ear fullness, ear popping, sore throat, productive cough, wheezing in his sleep. Has tried otc medication with some relief. Denies fever, sinus pain, ear pain, chest pain, shob, n/v, abdominal pain, rashes. Smokes 1ppd. Denies history of asthma.   Donivin has a current medication list which includes the following prescription(s): cyclobenzaprine, meloxicam, and tramadol. Also is allergic to penicillins.  Rayson  has a past medical history of Allergy, GERD (gastroesophageal reflux disease), and Restless legs syndrome. Also  has a past surgical history that includes Knee surgery and Knee arthroscopy.  Objective:   Vitals: BP 122/72   Pulse (!) 116   Temp 97.7 F (36.5 C) (Oral)   Resp 17   Ht 5' 10.5" (1.791 m)   Wt 220 lb (99.8 kg)   SpO2 98%   BMI 31.12 kg/m   Physical Exam  Constitutional: He is oriented to person, place, and time. He appears well-developed and well-nourished.  HENT:  TM's intact bilaterally, no effusions or erythema. Nasal turbinates pink and moist, nasal passages patent. No sinus tenderness. Oropharynx clear, mucous membranes moist.  Eyes: Right eye exhibits no discharge. Left eye exhibits no discharge.  Neck: Normal range of motion. Neck supple.  Cardiovascular: Regular rhythm and intact distal pulses. Exam reveals no gallop and no friction rub.  No murmur heard. Pulmonary/Chest: No respiratory distress. He has no wheezes. He has no rales.  Lymphadenopathy:    He has no cervical adenopathy.  Neurological: He is alert and oriented to person, place, and time.  Skin: Skin is warm and dry.  Psychiatric: He has a normal mood and affect.   Assessment and Plan :   1. Viral URI with cough 2. Sore throat - Likely viral in nature, advised supportive care, cough suppression medications. Return-to-clinic precautions discussed, patient  verbalized understanding.   3. Tobacco use disorder - Hold off on smoking until symptoms resolve.   4. Environmental allergies - Start Zyrtec and use Sudafed as needed for nasal congestion.  Jaynee Eagles, PA-C [facility] [place] [phone] 04/28/2017  10:59 AM

## 2017-05-18 DIAGNOSIS — H5213 Myopia, bilateral: Secondary | ICD-10-CM | POA: Diagnosis not present

## 2017-05-22 DIAGNOSIS — M25561 Pain in right knee: Secondary | ICD-10-CM | POA: Diagnosis not present

## 2017-05-22 DIAGNOSIS — G8929 Other chronic pain: Secondary | ICD-10-CM | POA: Diagnosis not present

## 2017-05-22 DIAGNOSIS — M25562 Pain in left knee: Secondary | ICD-10-CM | POA: Diagnosis not present

## 2017-05-22 DIAGNOSIS — Z4789 Encounter for other orthopedic aftercare: Secondary | ICD-10-CM | POA: Diagnosis not present

## 2017-05-27 DIAGNOSIS — Z1389 Encounter for screening for other disorder: Secondary | ICD-10-CM | POA: Diagnosis not present

## 2017-05-27 DIAGNOSIS — E6609 Other obesity due to excess calories: Secondary | ICD-10-CM | POA: Diagnosis not present

## 2017-05-27 DIAGNOSIS — G894 Chronic pain syndrome: Secondary | ICD-10-CM | POA: Diagnosis not present

## 2017-05-27 DIAGNOSIS — Z6831 Body mass index (BMI) 31.0-31.9, adult: Secondary | ICD-10-CM | POA: Diagnosis not present

## 2017-05-27 DIAGNOSIS — J410 Simple chronic bronchitis: Secondary | ICD-10-CM | POA: Diagnosis not present

## 2017-05-27 DIAGNOSIS — F17290 Nicotine dependence, other tobacco product, uncomplicated: Secondary | ICD-10-CM | POA: Diagnosis not present

## 2017-05-28 DIAGNOSIS — G8929 Other chronic pain: Secondary | ICD-10-CM | POA: Diagnosis not present

## 2017-05-28 DIAGNOSIS — M25562 Pain in left knee: Secondary | ICD-10-CM | POA: Diagnosis not present

## 2017-06-05 DIAGNOSIS — M1712 Unilateral primary osteoarthritis, left knee: Secondary | ICD-10-CM | POA: Diagnosis not present

## 2017-07-13 ENCOUNTER — Ambulatory Visit (INDEPENDENT_AMBULATORY_CARE_PROVIDER_SITE_OTHER): Payer: 59 | Admitting: Physician Assistant

## 2017-07-13 ENCOUNTER — Other Ambulatory Visit: Payer: Self-pay

## 2017-07-13 ENCOUNTER — Encounter: Payer: Self-pay | Admitting: Physician Assistant

## 2017-07-13 ENCOUNTER — Ambulatory Visit (INDEPENDENT_AMBULATORY_CARE_PROVIDER_SITE_OTHER): Payer: 59

## 2017-07-13 VITALS — BP 136/97 | HR 100 | Temp 97.9°F | Resp 18 | Ht 71.26 in | Wt 222.8 lb

## 2017-07-13 DIAGNOSIS — G8929 Other chronic pain: Secondary | ICD-10-CM | POA: Diagnosis not present

## 2017-07-13 DIAGNOSIS — M7050 Other bursitis of knee, unspecified knee: Secondary | ICD-10-CM | POA: Diagnosis not present

## 2017-07-13 DIAGNOSIS — M25561 Pain in right knee: Secondary | ICD-10-CM | POA: Diagnosis not present

## 2017-07-13 MED ORDER — NAPROXEN 500 MG PO TABS: 500.0000 mg | ORAL_TABLET | Freq: Two times a day (BID) | ORAL | 0 refills | Status: DC

## 2017-07-13 NOTE — Progress Notes (Deleted)
Tyler Mack is a 52 y.o. male   HPI:  Knee Pain: Patient presents {Knee RFV:14258} involving the  {R/L:30031} knee. Onset of the symptoms was {onset:14048}. Inciting event: {inciting event:14349}. Current symptoms include {Symptoms:14260}. Pain is aggravated by {knee pain aggravating factors:14088}.  Patient {has had:32492} prior knee problems. Evaluation to date: {knee pain eval to date:14090}. Treatment to date: {knee pain tx to date:14089}.  Past Medical History:  Diagnosis Date  . Allergy   . GERD (gastroesophageal reflux disease)   . Restless legs syndrome    Past Surgical History:  Procedure Laterality Date  . KNEE ARTHROSCOPY    . KNEE SURGERY      Current Outpatient Medications:  .  benzonatate (TESSALON) 100 MG capsule, Take 1-2 capsules (100-200 mg total) 3 (three) times daily as needed by mouth., Disp: 60 capsule, Rfl: 0 .  cetirizine (ZYRTEC) 10 MG tablet, Take 1 tablet (10 mg total) daily by mouth., Disp: 30 tablet, Rfl: 11 .  cyclobenzaprine (FLEXERIL) 10 MG tablet, Take 1-2 tablets (10-20 mg total) by mouth at bedtime., Disp: 60 tablet, Rfl: 5 .  HYDROcodone-homatropine (HYCODAN) 5-1.5 MG/5ML syrup, Take 5 mLs at bedtime as needed by mouth., Disp: 100 mL, Rfl: 0 .  traMADol (ULTRAM) 50 MG tablet, Take 1 tablet (50 mg total) by mouth every 6 (six) hours as needed., Disp: 30 tablet, Rfl: 5 .  meloxicam (MOBIC) 7.5 MG tablet, Take 1 tablet (7.5 mg total) by mouth daily. (Patient not taking: Reported on 07/13/2017), Disp: 30 tablet, Rfl: 5 .  pseudoephedrine (SUDAFED) 60 MG tablet, Take 1 tablet (60 mg total) every 8 (eight) hours as needed by mouth. (Patient not taking: Reported on 07/13/2017), Disp: 30 tablet, Rfl: 0 Allergies  Allergen Reactions  . Penicillins     reports that he has been smoking cigarettes.  He has been smoking about 0.50 packs per day. he has never used smokeless tobacco. He reports that he drinks about 1.2 oz of alcohol per week. He reports that he does  not use drugs. Family History  Problem Relation Age of Onset  . Diabetes Mother   . Stomach cancer Father   . Heart disease Brother   . Hyperlipidemia Brother   . Hypertension Brother   . Mental illness Brother   . Alcohol abuse Brother   . Drug abuse Brother   . Asthma Son   . Alcohol abuse Maternal Aunt   . Cancer Maternal Aunt   . Alcohol abuse Maternal Uncle   . Cancer Maternal Uncle     Knee: Normal to inspection with no erythema or effusion or obvious bony abnormalities. Palpation normal with no warmth or joint line tenderness or patellar tenderness or condyle tenderness. ROM normal in flexion and extension and lower leg rotation. Ligaments with solid consistent endpoints including ACL, PCL, LCL, MCL. Negative Mcmurray's and provocative meniscal tests. Non painful patellar compression. Patellar and quadriceps tendons unremarkable. Hamstring and quadriceps strength is normal.

## 2017-07-13 NOTE — Patient Instructions (Addendum)
Your x-ray is normal which is reassuring.  I recommend to use naproxen twice a day as needed for pain.  Do not take Advil,  Ibuprofen, or meloxicam while on this medication.  Also recommend applying ice to the affected area 4-5 times a day for 20 minutes at a time.  After a few days of ice, I recommend trying heat to the affected area.  We have provided you with a brace, I recommend wearing this daily during activity.  I have placed a referral for orthopedics and they should contact you within 1 week, please call us if you do not hear anything by that point.  The medication I mentioned in office that has Tylenol and tramadol combined into one pill is called Ultracet.  I recommend asking your doctor about this medication. Return to clinic if symptoms worsen, do not improve, or as needed   Pes Anserine Bursitis The pes anserine is an area on the inside of your knee, just below the joint, which is cushioned by a fluid-filled sac (bursa). Pes anserine bursitis is a condition that happens when this bursa gets swollen and irritated. The condition causes knee pain. What are the causes? This condition may be caused by:  Making the same movement over and over.  A hit to the inside of the leg.  What increases the risk? This injury is most likely to develop in:  Runners.  Athletes who play sports that involve a lot of running and quick side-to-side movements (cutting).  Athletes who play contact sports.  People who swim using an inward angle of the knee, such as with the breaststroke.  People with tight hamstring muscles.  Females.  People who are overweight.  People with flat feet.  People who have diabetes or osteoarthritis.  What are the signs or symptoms? Symptoms of this condition include:  Knee pain that gets better with rest and worse with activities like climbing stairs, walking, running, or getting in and out of a chair (common).  Swelling.  Warmth.  Tenderness when pressing  at the inside of the lower leg, just below the knee.  How is this diagnosed? This condition may be diagnosed based on:  Your symptoms.  Your medical history.  A physical exam.  Tests, such as: ? X-rays. ? MRI and ultrasound. These tests are done to check for swelling and fluid buildup in the bursa and to look at muscles and tendons.  During your physical exam, your health care provider will press on the tendon attachment to see if you feel pain. He or she may also check your hip and knee motion and strength. How is this treated? This condition may be treated by:  Resting your knee.  Avoiding activities that cause pain.  Icing the inside of your knee.  Raising (elevating) your knee while resting.  Sleeping with a pillow between your knees. This will cushion your injured knee.  Taking medicine to reduce pain and swelling.  Having medicines injected into your knee.  Doing strengthening and stretching exercises (physical therapy).  If these treatments do not work or if the condition keeps coming back, you may need to have surgery to remove the bursa. Follow these instructions at home: Managing pain, stiffness, and swelling  If directed, apply ice to your knee. ? Put ice in a plastic bag. ? Place a towel between your skin and the bag. ? Leave the ice on for 20 minutes, 2-3 times a day.  While you are sitting, elevate your knee.  While you are lying down, elevate your knee above the level of your heart.  Take over-the-counter and prescription medicines only as told by your health care provider. Activity  Return to your normal activities as told by your health care provider. Ask your health care provider what activities are safe for you.  Do exercises as told by your health care provider. General instructions  Sleep with a pillow between your knees.  Do not use any tobacco products, such as cigarettes, chewing tobacco, and e-cigarettes. Tobacco can delay healing. If  you need help quitting, ask your health care provider.  Keep all follow-up visits as told by your health care provider. This is important. How is this prevented?  Warm up and stretch before being active.  Cool down and stretch after being active.  Give your body time to rest between periods of activity.  Make sure to use equipment that fits you.  Be safe and responsible while being active to avoid falls.  Do at least 150 minutes of moderate-intensity exercise each week, such as brisk walking or water aerobics.  Maintain physical fitness, including: ? Strength. ? Flexibility. ? Cardiovascular fitness. ? Endurance. Contact a health care provider if:  Your symptoms do not improve.  Your symptoms get worse. This information is not intended to replace advice given to you by your health care provider. Make sure you discuss any questions you have with your health care provider. Document Released: 06/02/2005 Document Revised: 02/05/2016 Document Reviewed: 05/18/2015 Elsevier Interactive Patient Education  2018 [place].   Pes Anserine Bursitis Rehab Ask your health care provider which exercises are safe for you. Do exercises exactly as told by your health care provider and adjust them as directed. It is normal to feel mild stretching, pulling, tightness, or discomfort as you do these exercises, but you should stop right away if you feel sudden pain or your pain gets worse.Do not begin these exercises until told by your health care provider. Stretching and range of motion exercises These exercises warm up your muscles and joints and improve the movement and flexibility of your knee. These exercises also help to relieve pain and stiffness. Exercise A: Hamstring, doorway  1. Lie on your back in front of a doorway with your left / right leg resting against the wall and your other leg flat on the floor in the doorway. There should be a slight bend in your left / right  knee. 2. Straighten your left / right knee. You should feel a stretch behind your knee or thigh. If you do not, scoot your buttocks closer to the door. 3. Hold this position for __________ seconds. Repeat __________ times. Complete this stretch __________ times a day. Exercise B: V-sit ( hamstrings and adductors) 1. Sit on the floor with your legs extended in a large "V" shape. Keep your knees straight during this exercise. 2. Keeping your head and back in a straight line, bend at your waist to reach for your left foot (position A). You should feel a stretch in your right inner thigh. 3. Hold for __________ seconds. Then slowly return to the upright position. 4. Keeping your head and back in a straight line, bend at your waist to reach forward (position B). You should feel a stretch behind both of your thighs or knees. 5. Hold for __________ seconds. Then slowly return to the upright position. 6. Keeping your head and back in a straight line, bend at your waist to reach for your right foot (position  C). You should feel a stretch in your left inner thigh. 7. Hold for __________ seconds. Then slowly return to the upright position. Repeat __________ times. Complete this exercise __________ times a day. Exercise C: Quadriceps, prone  1. Lie on your abdomen on a firm surface, such as a bed or padded floor. 2. Bend your left / right knee and hold your ankle. If you cannot reach your ankle or pant leg, loop a belt around your foot and grab the belt instead. 3. Gently pull your heel toward your buttocks. Your knee should not slide out to the side. You should feel a stretch in the front of your thigh and knee. 4. Hold this position for __________ seconds. Repeat __________ times. Complete this stretch __________ times a day. Strengthening exercises These exercises build strength and endurance in your knee and hip. Endurance is the ability to use your muscles for a long time, even after they get  tired. Exercise D: Terminal knee extension, quadriceps 1. Secure a long loop of rubber exercise band around a sturdy object like a table leg. 2. Put the band behind your left / right knee. Step back from where the band is secured to put tension on the band. 3. Slowly bend your left / right knee. Keep your left / right foot flat on the floor. 4. Tighten the muscle in the front of your thigh and push back against the band to straighten your knee. 5. Hold this position for __________ seconds. 6. Return to having your left / right knee bent. Repeat __________ times. Complete this stretch __________ times a day. Exercise E: Straight leg raises ( hip abductors) 1. Lie on your side with your left / right leg in the top position. Your head, shoulder, knee, and hip should line up. You may bend your bottom knee to help you balance. 2. Lift your top leg 4-6 inches (10-15 cm) while keeping your toes pointed straight ahead. 3. Hold this position for __________ seconds. 4. Slowly lower your leg to the starting position. 5. Allow your muscles to relax completely after each repetition. Repeat __________ times. Complete this exercise __________ times a day. This information is not intended to replace advice given to you by your health care provider. Make sure you discuss any questions you have with your health care provider. Document Released: 06/02/2005 Document Revised: 02/05/2016 Document Reviewed: 05/18/2015 Elsevier Interactive Patient Education  2018 [employer].   IF you received an x-ray today, you will receive an invoice from [facility]. Please contact [facility] at [phone] with questions or concerns regarding your invoice.   IF you received labwork today, you will receive an invoice from [facility]. Please contact [facility] at [phone] with questions or concerns regarding your invoice.   Our billing staff will not be able to assist you with questions regarding bills  from these companies.  You will be contacted with the lab results as soon as they are available. The fastest way to get your results is to activate your My Chart account. Instructions are located on the last page of this paperwork. If you have not heard from [place] regarding the results in 2 weeks, please contact this office.

## 2017-07-13 NOTE — Progress Notes (Signed)
Tyler Mack  MRN: [id number] DOB: 30-Mar-1966  Subjective:  Tyler Mack is a 52 y.o. male . HPI:  Knee Pain: Patient presents with knee pain involving the  right knee. Onset of the symptoms was 5 days ago. Inciting event: none known. He went to help his sister move a week ago and did this fine but after he got home he noticed the pain. Current symptoms include pain located anteriolateral aspect of knee and stiffness. Pain is aggravated by any weight bearing and bending.  Has some swelling. Denies redness, warmth, numbness, and tingling Patient has had prior knee problems. Evaluation to date: arthroscopy of right knee in 2008. Has had prior meniscus tears in this knee. 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 No issues since 2008.. Treatment to date: brace which is ineffective, tramadol, and flexeril which has not been effective. Has not tried anti-inflammatories. Notes he has tried knee exercises with on relief. No PMH of gout.   Review of Systems  Constitutional: Negative for chills, fatigue and fever.   There are no active problems to display for this patient.   Current Outpatient Medications on File Prior to Visit  Medication Sig Dispense Refill  . benzonatate (TESSALON) 100 MG capsule Take 1-2 capsules (100-200 mg total) 3 (three) times daily as needed by mouth. 60 capsule 0  . cetirizine (ZYRTEC) 10 MG tablet Take 1 tablet (10 mg total) daily by mouth. 30 tablet 11  . cyclobenzaprine (FLEXERIL) 10 MG tablet Take 1-2 tablets (10-20 mg total) by mouth at bedtime. 60 tablet 5  . HYDROcodone-homatropine (HYCODAN) 5-1.5 MG/5ML syrup Take 5 mLs at bedtime as needed by mouth. 100 mL 0  . traMADol (ULTRAM) 50 MG tablet Take 1 tablet (50 mg total) by mouth every 6 (six) hours as needed. 30 tablet 5  . meloxicam (MOBIC) 7.5 MG tablet Take 1 tablet (7.5 mg total) by mouth daily. (Patient not taking: Reported on 07/13/2017) 30 tablet 5  . pseudoephedrine (SUDAFED) 60 MG tablet Take 1 tablet (60  mg total) every 8 (eight) hours as needed by mouth. (Patient not taking: Reported on 07/13/2017) 30 tablet 0   No current facility-administered medications on file prior to visit.     Allergies  Allergen Reactions  . Penicillins       Social History   Socioeconomic History  . Marital status: Married    Spouse name: Romie Minus  . Number of children: 2  . Years of education: Not on file  . Highest education level: Not on file  Social Needs  . Financial resource strain: Not on file  . Food insecurity - worry: Not on file  . Food insecurity - inability: Not on file  . Transportation needs - medical: Not on file  . Transportation needs - non-medical: Not on file  Occupational History  . Not on file  Tobacco Use  . Smoking status: Current Every Day Smoker    Packs/day: 0.50    Types: Cigarettes  . Smokeless tobacco: Never Used  Substance and Sexual Activity  . Alcohol use: Yes    Alcohol/week: 1.2 oz    Types: 2 Shots of liquor per week  . Drug use: No  . Sexual activity: Yes    Birth control/protection: Condom  Other Topics Concern  . Not on file  Social History Narrative  . Not on file    Objective:  BP (!) 136/97 (BP Location: Right Arm, Patient Position: Sitting, Cuff Size: Normal)  Pulse 100   Temp 97.9 F (36.6 C) (Oral)   Resp 18   Ht 5' 11.26" (1.81 m)   Wt 222 lb 12.8 oz (101.1 kg)   SpO2 98%   BMI 30.85 kg/m   Physical Exam  Constitutional: He is oriented to person, place, and time and well-developed, well-nourished, and in no distress.  HENT:  Head: Normocephalic and atraumatic.  Eyes: Conjunctivae are normal.  Neck: Normal range of motion.  Pulmonary/Chest: Effort normal.  Musculoskeletal:       Right knee: He exhibits decreased range of motion. He exhibits no swelling, no ecchymosis and no erythema. Tenderness (with palpation of pes anserine bursa and at medial joint line) found. Medial joint line tenderness noted.  Right knee exam difficult due to  pain.  No overlying erythema or warmth of right knee. Negative anterior and posterior drawer test.   Neurological: He is alert and oriented to person, place, and time. Gait (limping due to knee pain) abnormal.  Skin: Skin is warm and dry.  Psychiatric: Affect normal.  Vitals reviewed.  Dg Knee Complete 4 Views Right  Result Date: 07/13/2017 CLINICAL DATA:  Worsening right knee pain EXAM: RIGHT KNEE - COMPLETE 4+ VIEW COMPARISON:  None. FINDINGS: No evidence of fracture, dislocation, or joint effusion. No evidence of arthropathy or other focal bone abnormality. Soft tissues are unremarkable. IMPRESSION: Negative. Electronically Signed   By: Rolm Baptise M.D.   On: 07/13/2017 17:18     Assessment and Plan :  1. Acute pain of right knee Patient has long-standing history of chronic knee pain but this particular pain has been present for 5 days. Difficult to perform right knee exam due to pain. No overlying swelling, warmth, or erythema noted. Plain films with no acute abnormality. Pt is tender at pes anserine bursa. Recommend rest, ice, anti-inflammatories prn, and knee brace. Plan to follow up with orthopedics for further evaluation.  - DG Knee Complete 4 Views Right; Future - Ambulatory referral to Orthopedic Surgery - naproxen (NAPROSYN) 500 MG tablet; Take 1 tablet (500 mg total) by mouth 2 (two) times daily with a meal.  Dispense: 30 tablet; Refill: 0 2. Pes anserine bursitis - naproxen (NAPROSYN) 500 MG tablet; Take 1 tablet (500 mg total) by mouth 2 (two) times daily with a meal.  Dispense: 30 tablet; Refill: 0 - Care order/instruction: 3. Chronic pain of right knee - Ambulatory referral to [place] PA-C  [facility] 07/13/2017 5:21 PM

## 2017-07-14 ENCOUNTER — Encounter: Payer: Self-pay | Admitting: Physician Assistant

## 2017-07-20 ENCOUNTER — Ambulatory Visit (HOSPITAL_COMMUNITY)
Admission: EM | Admit: 2017-07-20 | Discharge: 2017-07-20 | Disposition: A | Discharge status: Home / Self Care | Payer: 59 | Attending: Family Medicine | Admitting: Family Medicine

## 2017-07-20 ENCOUNTER — Encounter (HOSPITAL_COMMUNITY): Payer: Self-pay | Admitting: Emergency Medicine

## 2017-07-20 ENCOUNTER — Other Ambulatory Visit: Payer: Self-pay

## 2017-07-20 DIAGNOSIS — R591 Generalized enlarged lymph nodes: Secondary | ICD-10-CM | POA: Diagnosis not present

## 2017-07-20 MED ORDER — CLINDAMYCIN HCL 150 MG PO CAPS: 450.0000 mg | ORAL_CAPSULE | Freq: Three times a day (TID) | ORAL | 0 refills | Status: AC

## 2017-07-20 NOTE — ED Triage Notes (Signed)
Pt states just this afternoon he started noting the lymph nodes on the R side of his neck get swollen.

## 2017-07-20 NOTE — ED Provider Notes (Signed)
[place]    CSN: [id number] Arrival date & time: 07/20/17  1905     History   Chief Complaint Chief Complaint  Patient presents with  . Lymphadenopathy    HPI MYLON MABEY is a 52 y.o. male.   Wille Glaser presents with complaints of pain and swelling to right face and jaw which started at approximately noon today and has been worsening. States he has had congestion and slight cough for a few weeks which has not been worsening and he has controlled with OTC treatments. Denies previous similar. Feels like his ears are popping. Pain with swallowing. Pain radiates to jaw. Denies dental pain. No known fevers. Does smoke. Took a tylenol earlier which did seem to help. History of seasonal allergies, gerd.     ROS per HPI.       Past Medical History:  Diagnosis Date  . Allergy   . GERD (gastroesophageal reflux disease)   . Restless legs syndrome     There are no active problems to display for this patient.   Past Surgical History:  Procedure Laterality Date  . KNEE ARTHROSCOPY    . KNEE SURGERY         Home Medications    Prior to Admission medications   Medication Sig Start Date End Date Taking? Authorizing Provider  benzonatate (TESSALON) 100 MG capsule Take 1-2 capsules (100-200 mg total) 3 (three) times daily as needed by mouth. 04/28/17  Yes Jaynee Eagles, PA-C  cetirizine (ZYRTEC) 10 MG tablet Take 1 tablet (10 mg total) daily by mouth. 04/28/17  Yes Jaynee Eagles, PA-C  cyclobenzaprine (FLEXERIL) 10 MG tablet Take 1-2 tablets (10-20 mg total) by mouth at bedtime. 03/14/17  Yes Jaynee Eagles, PA-C  traMADol (ULTRAM) 50 MG tablet Take 1 tablet (50 mg total) by mouth every 6 (six) hours as needed. 03/14/17  Yes Jaynee Eagles, PA-C  clindamycin (CLEOCIN) 150 MG capsule Take 3 capsules (450 mg total) by mouth 3 (three) times daily for 10 days. 07/20/17 07/30/17  Zigmund Gottron, NP  HYDROcodone-homatropine (HYCODAN) 5-1.5 MG/5ML syrup Take 5 mLs at bedtime as needed by  mouth. 04/28/17   Jaynee Eagles, PA-C  naproxen (NAPROSYN) 500 MG tablet Take 1 tablet (500 mg total) by mouth 2 (two) times daily with a meal. 07/13/17   Leonie Douglas, PA-C    Family History Family History  Problem Relation Age of Onset  . Diabetes Mother   . Stomach cancer Father   . Heart disease Brother   . Hyperlipidemia Brother   . Hypertension Brother   . Mental illness Brother   . Alcohol abuse Brother   . Drug abuse Brother   . Asthma Son   . Alcohol abuse Maternal Aunt   . Cancer Maternal Aunt   . Alcohol abuse Maternal Uncle   . Cancer Maternal Uncle     Social History Social History   Tobacco Use  . Smoking status: Current Every Day Smoker    Packs/day: 0.50    Types: Cigarettes  . Smokeless tobacco: Never Used  Substance Use Topics  . Alcohol use: Yes    Alcohol/week: 1.2 oz    Types: 2 Shots of liquor per week  . Drug use: No     Allergies   Penicillins   Review of Systems Review of Systems   Physical Exam Triage Vital Signs ED Triage Vitals  Enc Vitals Group     BP 07/20/17 2008 134/86     Pulse Rate 07/20/17  2008 99     Resp 07/20/17 2008 18     Temp 07/20/17 2008 98.5 F (36.9 C)     Temp src --      SpO2 07/20/17 2008 99 %     Weight --      Height --      Head Circumference --      Peak Flow --      Pain Score 07/20/17 2009 7     Pain Loc --      Pain Edu? --      Excl. in [place]? --    No data found.  Updated Vital Signs BP 134/86   Pulse 99   Temp 98.5 F (36.9 C)   Resp 18   SpO2 99%   Visual Acuity Right Eye Distance:   Left Eye Distance:   Bilateral Distance:    Right Eye Near:   Left Eye Near:    Bilateral Near:     Physical Exam  Constitutional: He is oriented to person, place, and time. He appears well-developed and well-nourished.  HENT:  Head: Normocephalic and atraumatic.    Right Ear: Tympanic membrane, external ear and ear canal normal.  Left Ear: Tympanic membrane, external ear and ear canal  normal.  Nose: Nose normal. Right sinus exhibits no maxillary sinus tenderness and no frontal sinus tenderness. Left sinus exhibits no maxillary sinus tenderness and no frontal sinus tenderness.  Mouth/Throat: Uvula is midline, oropharynx is clear and moist and mucous membranes are normal. Abnormal dentition. No posterior oropharyngeal edema or posterior oropharyngeal erythema. Tonsils are 1+ on the right. Tonsils are 1+ on the left. No tonsillar exudate.  Tenderness anterior to right ear extending distal to and below jaw; minimal palpable swelling present, very tender; right lower molar exposed as crown is off; without point tenderness at jaw below tooth  Eyes: Conjunctivae are normal. Pupils are equal, round, and reactive to light.  Neck: Normal range of motion.  Cardiovascular: Normal rate and regular rhythm.  Pulmonary/Chest: Effort normal and breath sounds normal.  Lymphadenopathy:    He has no cervical adenopathy.  Neurological: He is alert and oriented to person, place, and time.  Skin: Skin is warm and dry.  Vitals reviewed.    UC Treatments / Results  Labs (all labs ordered are listed, but only abnormal results are displayed) Labs Reviewed - No data to display  EKG  EKG Interpretation None       Radiology No results found.  Procedures Procedures (including critical care time)  Medications Ordered in UC Medications - No data to display   Initial Impression / Assessment and Plan / UC Course  I have reviewed the triage vital signs and the nursing notes.  Pertinent labs & imaging results that were available during my care of the patient were reviewed by me and considered in my medical decision making (see chart for details).     Right sided face tenderness. Parotitis vs lymphadenopathy vs dental source of pain discussed with patient. Opted to initiate antibiotics at this time. Encouraged sucking on hard candies, OTC treatments for congestion, follow up with dentist.  Return precautions provided. Patient verbalized understanding and agreeable to plan.    Final Clinical Impressions(s) / UC Diagnoses   Final diagnoses:  Lymphadenopathy    ED Discharge Orders        Ordered    clindamycin (CLEOCIN) 150 MG capsule  3 times daily     07/20/17 2050  Controlled Substance Prescriptions [place] Controlled Substance Registry consulted? Not Applicable   Zigmund Gottron, NP 07/20/17 2056

## 2017-07-20 NOTE — Discharge Instructions (Signed)
Push fluids to ensure adequate hydration and keep secretions thin.  Suck on hard candies to promote salivation.  Complete course of antibiotics. Ibuprofen as needed for pain.  Warm packs. Follow up with dentist.  If symptoms worsen or do not improve in the next week to return to be seen or to follow up with your PCP.

## 2017-08-04 DIAGNOSIS — M25561 Pain in right knee: Secondary | ICD-10-CM | POA: Diagnosis not present

## 2017-09-16 DIAGNOSIS — Z1389 Encounter for screening for other disorder: Secondary | ICD-10-CM | POA: Diagnosis not present

## 2017-09-16 DIAGNOSIS — G894 Chronic pain syndrome: Secondary | ICD-10-CM | POA: Diagnosis not present

## 2017-09-16 DIAGNOSIS — Z6831 Body mass index (BMI) 31.0-31.9, adult: Secondary | ICD-10-CM | POA: Diagnosis not present

## 2017-09-16 DIAGNOSIS — E6609 Other obesity due to excess calories: Secondary | ICD-10-CM | POA: Diagnosis not present

## 2017-09-25 ENCOUNTER — Telehealth: Payer: Self-pay | Admitting: Family Medicine

## 2017-09-25 NOTE — Telephone Encounter (Signed)
Error-no note

## 2017-10-30 DIAGNOSIS — R7309 Other abnormal glucose: Secondary | ICD-10-CM | POA: Diagnosis not present

## 2017-10-30 DIAGNOSIS — Z1389 Encounter for screening for other disorder: Secondary | ICD-10-CM | POA: Diagnosis not present

## 2017-10-30 DIAGNOSIS — M179 Osteoarthritis of knee, unspecified: Secondary | ICD-10-CM | POA: Diagnosis not present

## 2017-10-30 DIAGNOSIS — E782 Mixed hyperlipidemia: Secondary | ICD-10-CM | POA: Diagnosis not present

## 2017-10-30 DIAGNOSIS — Z6830 Body mass index (BMI) 30.0-30.9, adult: Secondary | ICD-10-CM | POA: Diagnosis not present

## 2017-10-30 DIAGNOSIS — G894 Chronic pain syndrome: Secondary | ICD-10-CM | POA: Diagnosis not present

## 2017-10-30 DIAGNOSIS — E6609 Other obesity due to excess calories: Secondary | ICD-10-CM | POA: Diagnosis not present

## 2017-10-30 DIAGNOSIS — Z0001 Encounter for general adult medical examination with abnormal findings: Secondary | ICD-10-CM | POA: Diagnosis not present

## 2017-12-16 DIAGNOSIS — M179 Osteoarthritis of knee, unspecified: Secondary | ICD-10-CM | POA: Diagnosis not present

## 2017-12-16 DIAGNOSIS — Z6829 Body mass index (BMI) 29.0-29.9, adult: Secondary | ICD-10-CM | POA: Diagnosis not present

## 2017-12-16 DIAGNOSIS — Z1389 Encounter for screening for other disorder: Secondary | ICD-10-CM | POA: Diagnosis not present

## 2017-12-16 DIAGNOSIS — G2581 Restless legs syndrome: Secondary | ICD-10-CM | POA: Diagnosis not present

## 2017-12-16 DIAGNOSIS — G894 Chronic pain syndrome: Secondary | ICD-10-CM | POA: Diagnosis not present

## 2017-12-16 DIAGNOSIS — E663 Overweight: Secondary | ICD-10-CM | POA: Diagnosis not present

## 2018-01-08 DIAGNOSIS — E782 Mixed hyperlipidemia: Secondary | ICD-10-CM | POA: Diagnosis not present

## 2018-01-08 DIAGNOSIS — Z0001 Encounter for general adult medical examination with abnormal findings: Secondary | ICD-10-CM | POA: Diagnosis not present

## 2018-01-08 DIAGNOSIS — Z1389 Encounter for screening for other disorder: Secondary | ICD-10-CM | POA: Diagnosis not present

## 2018-01-08 DIAGNOSIS — Z113 Encounter for screening for infections with a predominantly sexual mode of transmission: Secondary | ICD-10-CM | POA: Diagnosis not present

## 2018-03-23 DIAGNOSIS — Z1389 Encounter for screening for other disorder: Secondary | ICD-10-CM | POA: Diagnosis not present

## 2018-03-23 DIAGNOSIS — L720 Epidermal cyst: Secondary | ICD-10-CM | POA: Diagnosis not present

## 2018-03-23 DIAGNOSIS — G894 Chronic pain syndrome: Secondary | ICD-10-CM | POA: Diagnosis not present

## 2018-03-23 DIAGNOSIS — M179 Osteoarthritis of knee, unspecified: Secondary | ICD-10-CM | POA: Diagnosis not present

## 2018-03-23 DIAGNOSIS — M1991 Primary osteoarthritis, unspecified site: Secondary | ICD-10-CM | POA: Diagnosis not present

## 2018-06-03 DIAGNOSIS — M79644 Pain in right finger(s): Secondary | ICD-10-CM | POA: Diagnosis not present

## 2018-06-03 DIAGNOSIS — E663 Overweight: Secondary | ICD-10-CM | POA: Diagnosis not present

## 2018-06-03 DIAGNOSIS — M79641 Pain in right hand: Secondary | ICD-10-CM | POA: Diagnosis not present

## 2018-06-03 DIAGNOSIS — Z6827 Body mass index (BMI) 27.0-27.9, adult: Secondary | ICD-10-CM | POA: Diagnosis not present

## 2018-06-03 DIAGNOSIS — Z1389 Encounter for screening for other disorder: Secondary | ICD-10-CM | POA: Diagnosis not present

## 2018-06-04 ENCOUNTER — Other Ambulatory Visit: Payer: Self-pay | Admitting: Family Medicine

## 2018-06-04 ENCOUNTER — Ambulatory Visit
Admission: RE | Admit: 2018-06-04 | Discharge: 2018-06-04 | Disposition: A | Discharge status: Home / Self Care | Payer: 59 | Source: Ambulatory Visit | Attending: Family Medicine | Admitting: Family Medicine

## 2018-06-04 DIAGNOSIS — M79644 Pain in right finger(s): Secondary | ICD-10-CM

## 2018-06-04 DIAGNOSIS — M79641 Pain in right hand: Secondary | ICD-10-CM

## 2018-09-09 DIAGNOSIS — G894 Chronic pain syndrome: Secondary | ICD-10-CM | POA: Diagnosis not present

## 2018-09-09 DIAGNOSIS — F17200 Nicotine dependence, unspecified, uncomplicated: Secondary | ICD-10-CM | POA: Diagnosis not present

## 2018-09-09 DIAGNOSIS — M1991 Primary osteoarthritis, unspecified site: Secondary | ICD-10-CM | POA: Diagnosis not present

## 2018-10-15 MED FILL — buPROPion HCL ER (XL) 300 M: 300 | 90 days supply | Qty: 90 | Fill #0

## 2018-10-19 MED FILL — traMADol HCL 50 MG TABS: 50 | 15 days supply | Qty: 120 | Fill #0

## 2018-10-19 MED FILL — CYCLOBENZAPRINE HCL 10 MG T: 10 | 30 days supply | Qty: 60 | Fill #0

## 2018-11-17 MED FILL — CYCLOBENZAPRINE HCL 10 MG T: 10 | 30 days supply | Qty: 60 | Fill #1

## 2018-11-22 DIAGNOSIS — G894 Chronic pain syndrome: Secondary | ICD-10-CM | POA: Diagnosis not present

## 2018-11-22 DIAGNOSIS — M2550 Pain in unspecified joint: Secondary | ICD-10-CM | POA: Diagnosis not present

## 2018-11-22 DIAGNOSIS — R252 Cramp and spasm: Secondary | ICD-10-CM | POA: Diagnosis not present

## 2018-11-22 DIAGNOSIS — M1991 Primary osteoarthritis, unspecified site: Secondary | ICD-10-CM | POA: Diagnosis not present

## 2018-11-22 MED FILL — traMADol HCL 50 MG TABS: 50 | 15 days supply | Qty: 120 | Fill #0

## 2018-12-20 MED FILL — traMADol HCL 50 MG TABS: 50 | 15 days supply | Qty: 120 | Fill #1

## 2018-12-20 MED FILL — CYCLOBENZAPRINE HCL 10 MG T: 10 | 30 days supply | Qty: 60 | Fill #2

## 2018-12-25 DIAGNOSIS — H5213 Myopia, bilateral: Secondary | ICD-10-CM | POA: Diagnosis not present

## 2019-01-12 DIAGNOSIS — M25552 Pain in left hip: Secondary | ICD-10-CM | POA: Diagnosis not present

## 2019-01-12 DIAGNOSIS — M5136 Other intervertebral disc degeneration, lumbar region: Secondary | ICD-10-CM | POA: Diagnosis not present

## 2019-01-12 DIAGNOSIS — M5137 Other intervertebral disc degeneration, lumbosacral region: Secondary | ICD-10-CM | POA: Diagnosis not present

## 2019-01-12 DIAGNOSIS — M5440 Lumbago with sciatica, unspecified side: Secondary | ICD-10-CM | POA: Diagnosis not present

## 2019-01-12 DIAGNOSIS — M47816 Spondylosis without myelopathy or radiculopathy, lumbar region: Secondary | ICD-10-CM | POA: Diagnosis not present

## 2019-01-17 MED FILL — traMADol HCL 50 MG TABS: 50 | 15 days supply | Qty: 120 | Fill #2

## 2019-01-17 MED FILL — buPROPion HCL ER (XL) 300 M: 300 | 60 days supply | Qty: 60 | Fill #1

## 2019-01-20 MED FILL — CYCLOBENZAPRINE HCL 10 MG T: 10 | 30 days supply | Qty: 60 | Fill #0

## 2019-02-24 IMAGING — CR DG HAND COMPLETE 3+V*R*
3 series · 3 of 3 positions shown · non-contrast
Comparison: None.

CLINICAL DATA: Fifth metacarpal pain

EXAM:
RIGHT HAND - COMPLETE 3+ VIEW

[x hand pa left]
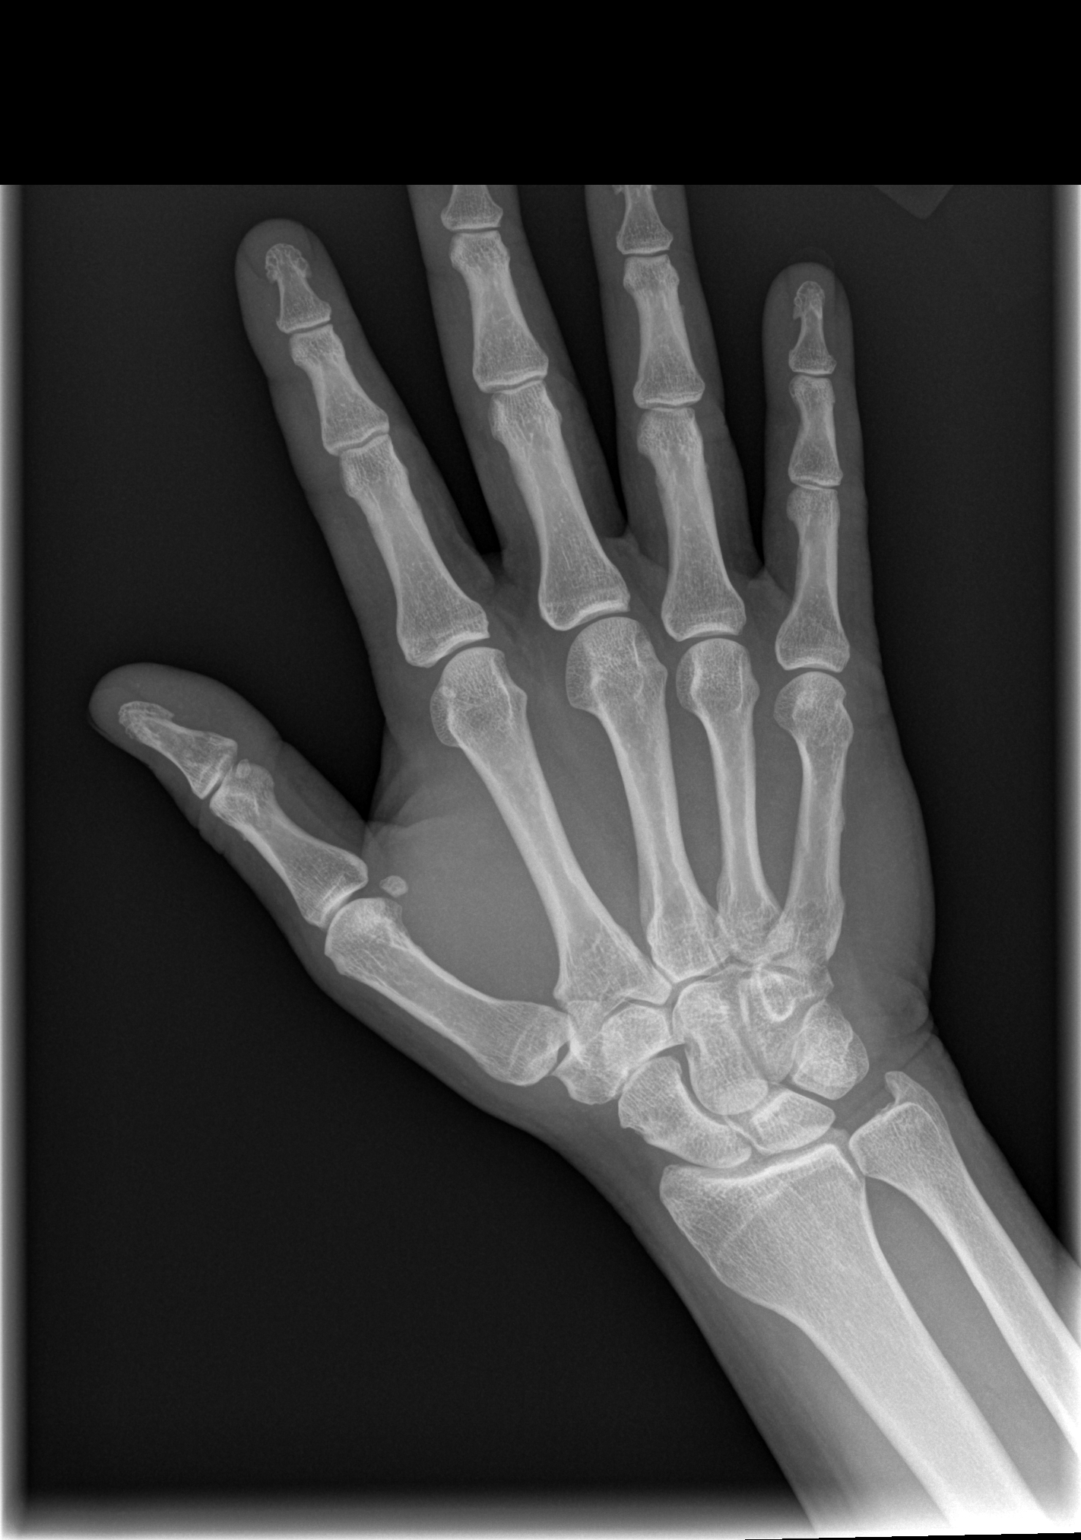

[x hand oblique left]
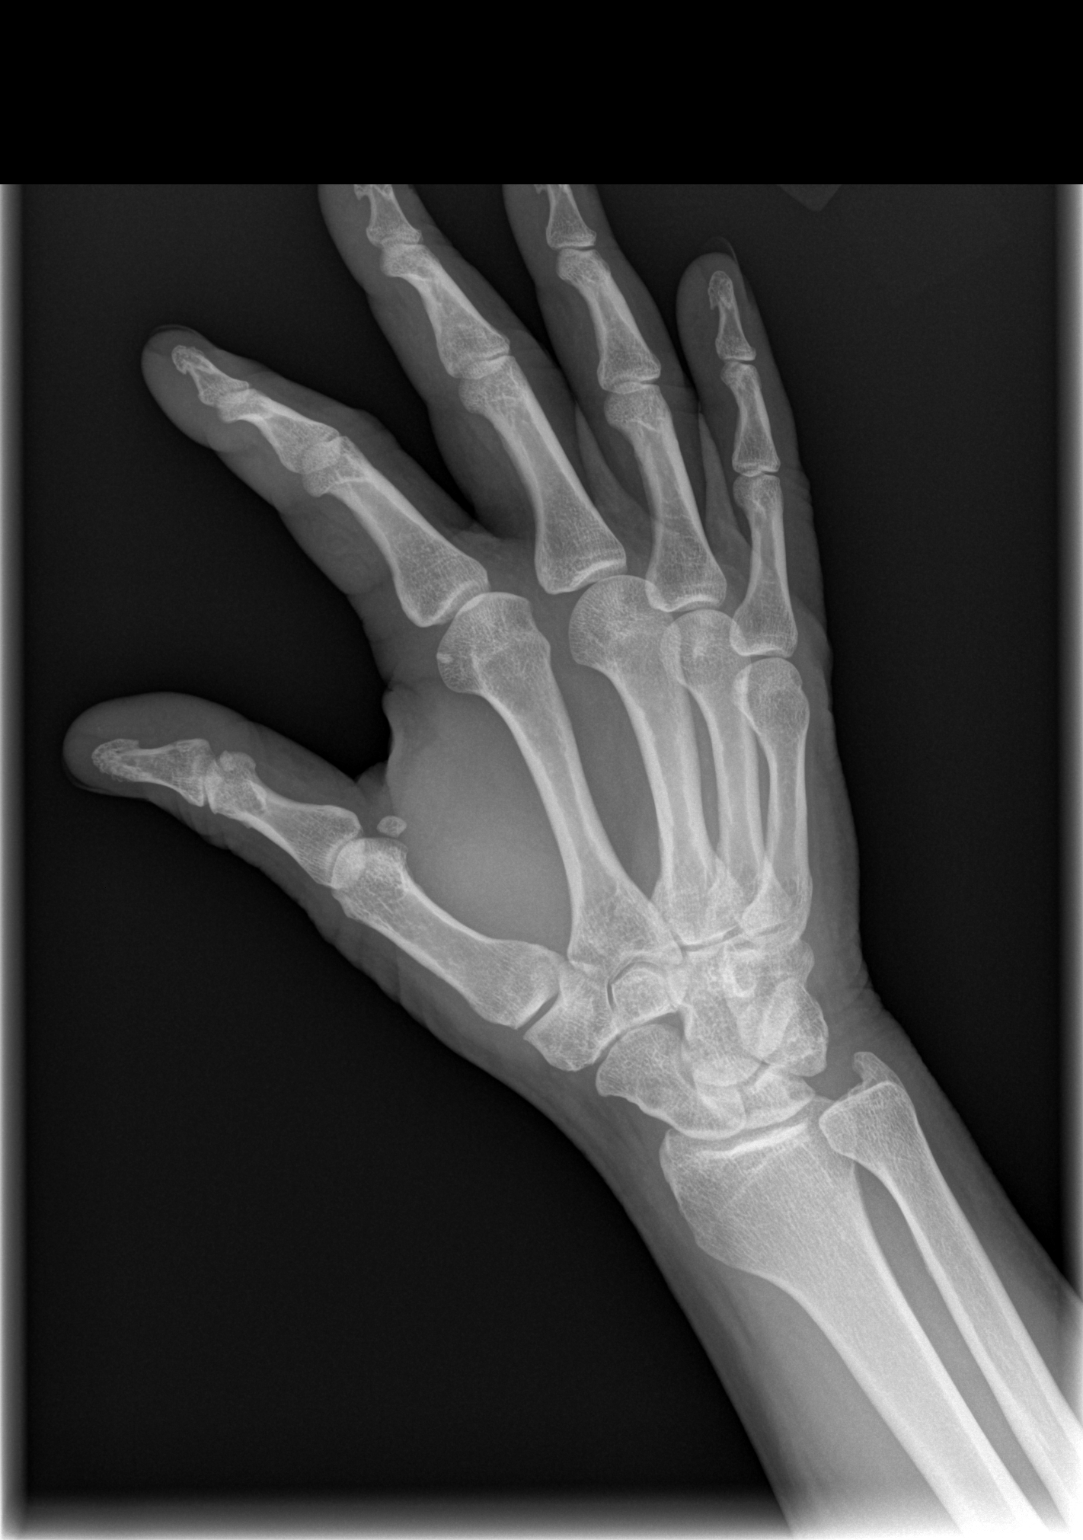

[x hand lat left]
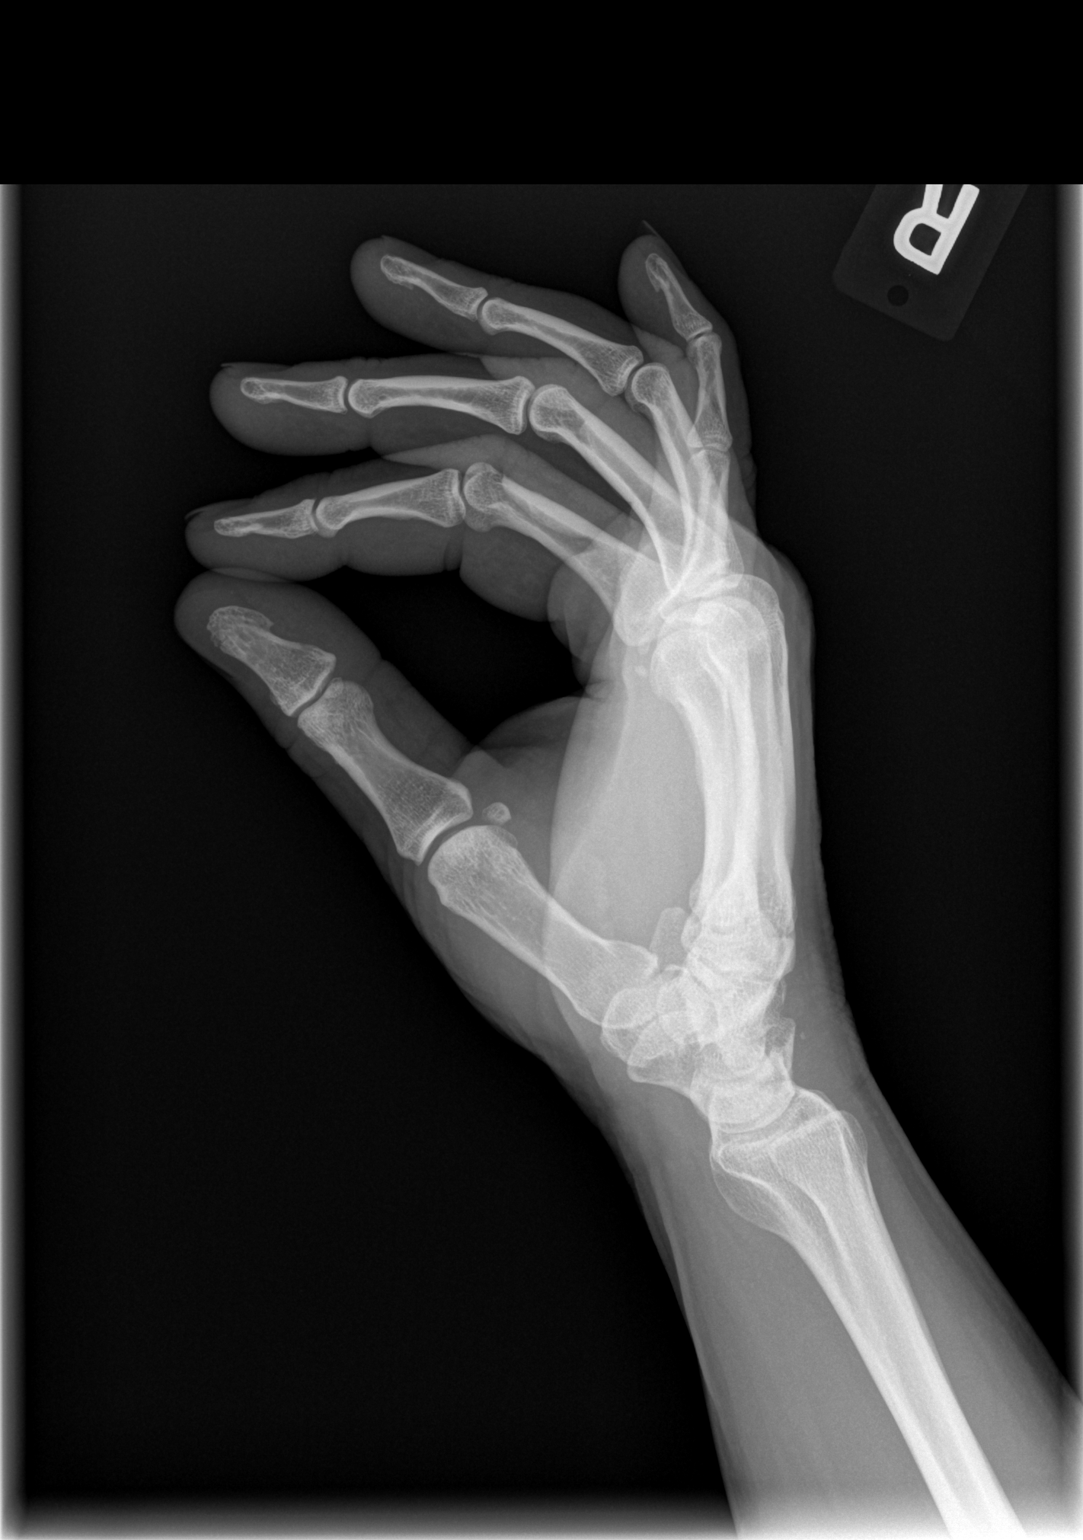

[3 of 3 positions shown; findings below may reference images not displayed]

FINDINGS: There is no evidence of fracture or dislocation. There is no
evidence of arthropathy or other focal bone abnormality. Soft
tissues are unremarkable.
IMPRESSION: Negative.

## 2019-03-09 DIAGNOSIS — E663 Overweight: Secondary | ICD-10-CM | POA: Diagnosis not present

## 2019-03-09 DIAGNOSIS — M1991 Primary osteoarthritis, unspecified site: Secondary | ICD-10-CM | POA: Diagnosis not present

## 2019-03-09 DIAGNOSIS — Z1389 Encounter for screening for other disorder: Secondary | ICD-10-CM | POA: Diagnosis not present

## 2019-03-09 DIAGNOSIS — Z6829 Body mass index (BMI) 29.0-29.9, adult: Secondary | ICD-10-CM | POA: Diagnosis not present

## 2019-03-09 MED FILL — CYCLOBENZAPRINE HCL 10 MG T: 10 | 30 days supply | Qty: 60 | Fill #0

## 2019-03-09 MED FILL — traMADol HCL 50 MG TABS: 50 | 15 days supply | Qty: 120 | Fill #0

## 2019-03-25 DIAGNOSIS — H5213 Myopia, bilateral: Secondary | ICD-10-CM | POA: Diagnosis not present

## 2019-04-06 ENCOUNTER — Encounter (HOSPITAL_COMMUNITY): Payer: Self-pay | Admitting: *Deleted

## 2019-04-06 ENCOUNTER — Emergency Department (HOSPITAL_COMMUNITY): Payer: 59

## 2019-04-06 ENCOUNTER — Other Ambulatory Visit: Payer: Self-pay

## 2019-04-06 ENCOUNTER — Emergency Department (HOSPITAL_COMMUNITY)
Admission: EM | Admit: 2019-04-06 | Discharge: 2019-04-06 | Disposition: A | Discharge status: Home / Self Care | Payer: 59 | Attending: Emergency Medicine | Admitting: Emergency Medicine

## 2019-04-06 DIAGNOSIS — M7918 Myalgia, other site: Secondary | ICD-10-CM | POA: Insufficient documentation

## 2019-04-06 DIAGNOSIS — F17210 Nicotine dependence, cigarettes, uncomplicated: Secondary | ICD-10-CM | POA: Insufficient documentation

## 2019-04-06 DIAGNOSIS — R0789 Other chest pain: Secondary | ICD-10-CM | POA: Diagnosis not present

## 2019-04-06 DIAGNOSIS — M25512 Pain in left shoulder: Secondary | ICD-10-CM

## 2019-04-06 DIAGNOSIS — Z79899 Other long term (current) drug therapy: Secondary | ICD-10-CM | POA: Insufficient documentation

## 2019-04-06 DIAGNOSIS — Z23 Encounter for immunization: Secondary | ICD-10-CM | POA: Insufficient documentation

## 2019-04-06 DIAGNOSIS — S0990XA Unspecified injury of head, initial encounter: Secondary | ICD-10-CM | POA: Diagnosis not present

## 2019-04-06 DIAGNOSIS — S199XXA Unspecified injury of neck, initial encounter: Secondary | ICD-10-CM | POA: Diagnosis not present

## 2019-04-06 DIAGNOSIS — S4992XA Unspecified injury of left shoulder and upper arm, initial encounter: Secondary | ICD-10-CM | POA: Diagnosis not present

## 2019-04-06 DIAGNOSIS — S299XXA Unspecified injury of thorax, initial encounter: Secondary | ICD-10-CM | POA: Diagnosis not present

## 2019-04-06 MED ORDER — LIDOCAINE 5 % EX PTCH
1.0000 | MEDICATED_PATCH | CUTANEOUS | Status: DC
  Administered 2019-04-06: 1 via TRANSDERMAL
  Filled 2019-04-06: qty 1

## 2019-04-06 MED ORDER — TETANUS-DIPHTH-ACELL PERTUSSIS 5-2.5-18.5 LF-MCG/0.5 IM SUSP
0.5000 mL | Freq: Once | INTRAMUSCULAR | Status: AC
  Administered 2019-04-06: 0.5 mL via INTRAMUSCULAR
  Filled 2019-04-06: qty 0.5

## 2019-04-06 MED ORDER — ACETAMINOPHEN 325 MG PO TABS
650.0000 mg | ORAL_TABLET | Freq: Once | ORAL | Status: AC
  Administered 2019-04-06: 650 mg via ORAL
  Filled 2019-04-06: qty 2

## 2019-04-06 MED ORDER — METHOCARBAMOL 500 MG PO TABS
500.0000 mg | ORAL_TABLET | Freq: Once | ORAL | Status: AC
  Administered 2019-04-06: 500 mg via ORAL
  Filled 2019-04-06: qty 1

## 2019-04-06 MED ORDER — BACITRACIN-NEOMYCIN-POLYMYXIN 400-5-5000 EX OINT
TOPICAL_OINTMENT | Freq: Once | CUTANEOUS | Status: AC
  Administered 2019-04-06: 1 via TOPICAL
  Filled 2019-04-06: qty 1
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
## 2019-04-06 NOTE — ED Notes (Signed)
Patient transported to CT 
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
## 2019-04-07 MED FILL — CYCLOBENZAPRINE HCL 10 MG T: 10 | 30 days supply | Qty: 60 | Fill #1

## 2019-04-07 MED FILL — traMADol HCL 50 MG TABS: 50 | 15 days supply | Qty: 120 | Fill #1

## 2019-04-11 ENCOUNTER — Telehealth: Payer: Self-pay | Admitting: Orthopedic Surgery
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
## 2019-04-12 DIAGNOSIS — Z23 Encounter for immunization: Secondary | ICD-10-CM | POA: Diagnosis not present

## 2019-04-12 DIAGNOSIS — S40012D Contusion of left shoulder, subsequent encounter: Secondary | ICD-10-CM | POA: Diagnosis not present

## 2019-04-12 DIAGNOSIS — T1490XD Injury, unspecified, subsequent encounter: Secondary | ICD-10-CM | POA: Diagnosis not present

## 2019-04-12 DIAGNOSIS — Z6829 Body mass index (BMI) 29.0-29.9, adult: Secondary | ICD-10-CM | POA: Diagnosis not present

## 2019-04-12 DIAGNOSIS — S5002XD Contusion of left elbow, subsequent encounter: Secondary | ICD-10-CM | POA: Diagnosis not present

## 2019-05-08 MED FILL — traMADol HCL 50 MG TABS: 50 | 15 days supply | Qty: 120 | Fill #2

## 2019-05-08 MED FILL — CYCLOBENZAPRINE HCL 10 MG T: 10 | 30 days supply | Qty: 60 | Fill #2

## 2019-06-08 DIAGNOSIS — G894 Chronic pain syndrome: Secondary | ICD-10-CM | POA: Diagnosis not present

## 2019-06-08 MED FILL — traMADol HCL 50 MG TABS: 50 | 15 days supply | Qty: 120 | Fill #0

## 2019-06-13 ENCOUNTER — Ambulatory Visit
Admission: EM | Admit: 2019-06-13 | Discharge: 2019-06-13 | Disposition: A | Discharge status: Home / Self Care | Payer: 59 | Attending: Emergency Medicine | Admitting: Emergency Medicine

## 2019-06-13 ENCOUNTER — Other Ambulatory Visit: Payer: Self-pay

## 2019-06-13 DIAGNOSIS — Z20828 Contact with and (suspected) exposure to other viral communicable diseases: Secondary | ICD-10-CM | POA: Diagnosis not present

## 2019-06-13 DIAGNOSIS — M542 Cervicalgia: Secondary | ICD-10-CM

## 2019-06-13 DIAGNOSIS — Z20822 Contact with and (suspected) exposure to covid-19: Secondary | ICD-10-CM

## 2019-06-13 MED ORDER — PREDNISONE 10 MG PO TABS: 20.0000 mg | ORAL_TABLET | Freq: Every day | ORAL | 0 refills | Status: DC

## 2019-06-13 MED ORDER — FLUTICASONE PROPIONATE 50 MCG/ACT NA SUSP: 1.0000 | Freq: Every day | NASAL | 0 refills | Status: DC

## 2019-06-13 MED ORDER — CYCLOBENZAPRINE HCL 10 MG PO TABS: 10.0000 mg | ORAL_TABLET | Freq: Two times a day (BID) | ORAL | 0 refills | Status: AC | PRN

## 2019-06-13 MED FILL — CYCLOBENZAPRINE HCL 10 MG T: 10 | 30 days supply | Qty: 60 | Fill #3

## 2019-06-13 NOTE — Discharge Instructions (Signed)
Advised patient to take his medication as prescribed Advised patient that Flexeril may make him sleepy To use Flonase after completing prednisone To follow-up with primary care To return for worsening of symptoms  COVID-19 result will take about 2 to 7 days for results to be available Quarantine until results become available.

## 2019-06-13 NOTE — ED Provider Notes (Signed)
[facility]    CSN: [id number] Arrival date & time: 06/13/19  1145      History   Chief Complaint Chief Complaint  Patient presents with  . Otalgia    HPI Tyler Mack is a 53 y.o. male.   Tyler Mack 53 years old male presents to the urgent care for complaint of neck pain of back pain for the past 3 days.  It started this started gradually in the morning 3 days ago.  He localizes the pain to the left side of his neck.  He describes the pain as constant and achy.  Rates the pain at 6 on a scale of 1-10.  He has tried OTC medications without relief.  His symptoms are made worse with ROM.  He denies similar symptoms in the past.     The history is provided by the patient. No language interpreter was used.  Otalgia Associated symptoms: neck pain     Past Medical History:  Diagnosis Date  . Allergy   . GERD (gastroesophageal reflux disease)   . Restless legs syndrome     There are no problems to display for this patient.   Past Surgical History:  Procedure Laterality Date  . KNEE ARTHROSCOPY    . KNEE SURGERY         Home Medications    Prior to Admission medications   Medication Sig Start Date End Date Taking? Authorizing Provider  buPROPion (WELLBUTRIN XL) 300 MG 24 hr tablet Take 300 mg by mouth daily.  10/15/18   [provider]  cyclobenzaprine (FLEXERIL) 10 MG tablet Take 1 tablet (10 mg total) by mouth 2 (two) times daily as needed for muscle spasms. 06/13/19   Sarabeth Benton, Darrelyn Hillock, FNP  fluticasone (FLONASE) 50 MCG/ACT nasal spray Place 1 spray into both nostrils daily for 14 days. 06/13/19 06/27/19  Taiz Bickle, Darrelyn Hillock, FNP  predniSONE (DELTASONE) 10 MG tablet Take 2 tablets (20 mg total) by mouth daily. 06/13/19   Karlina Suares, Darrelyn Hillock, FNP  traMADol (ULTRAM) 50 MG tablet Take 1 tablet (50 mg total) by mouth every 6 (six) hours as needed. 03/14/17   Jaynee Eagles, PA-C  cetirizine (ZYRTEC) 10 MG tablet Take 1 tablet (10 mg total) daily by mouth.  04/28/17 06/13/19  Jaynee Eagles, PA-C    Family History Family History  Problem Relation Age of Onset  . Diabetes Mother   . Stomach cancer Father   . Heart disease Brother   . Hyperlipidemia Brother   . Hypertension Brother   . Mental illness Brother   . Alcohol abuse Brother   . Drug abuse Brother   . Asthma Son   . Alcohol abuse Maternal Aunt   . Cancer Maternal Aunt   . Alcohol abuse Maternal Uncle   . Cancer Maternal Uncle     Social History Social History   Tobacco Use  . Smoking status: Current Every Day Smoker    Packs/day: 0.50    Types: Cigarettes  . Smokeless tobacco: Never Used  Substance Use Topics  . Alcohol use: Yes    Alcohol/week: 2.0 standard drinks    Types: 2 Shots of liquor per week    Comment: every other weekend  . Drug use: No     Allergies   Penicillins   Review of Systems Review of Systems  Constitutional: Negative.   HENT: Positive for ear pain.   Respiratory: Negative.   Cardiovascular: Negative.   Musculoskeletal: Positive for neck pain.  ROS: All  other are normal   Physical Exam Triage Vital Signs ED Triage Vitals  Enc Vitals Group     BP 06/13/19 1159 131/85     Pulse Rate 06/13/19 1159 92     Resp 06/13/19 1159 15     Temp 06/13/19 1159 98.8 F (37.1 C)     Temp Source 06/13/19 1159 Oral     SpO2 06/13/19 1159 97 %     Weight --      Height --      Head Circumference --      Peak Flow --      Pain Score 06/13/19 1213 8     Pain Loc --      Pain Edu? --      Excl. in [place]? --    No data found.  Updated Vital Signs BP 131/85 (BP Location: Right Arm)   Pulse 92   Temp 98.8 F (37.1 C) (Oral)   Resp 15   SpO2 97%   Visual Acuity Right Eye Distance:   Left Eye Distance:   Bilateral Distance:    Right Eye Near:   Left Eye Near:    Bilateral Near:     Physical Exam Constitutional:      General: He is not in acute distress.    Appearance: Normal appearance. He is normal weight. He is not ill-appearing  or toxic-appearing.  HENT:     Head: Normocephalic.     Right Ear: Tympanic membrane, ear canal and external ear normal. There is no impacted cerumen.     Left Ear: Tympanic membrane, ear canal and external ear normal. There is no impacted cerumen.     Nose: Nose normal. No congestion.     Mouth/Throat:     Mouth: Mucous membranes are moist.     Pharynx: No oropharyngeal exudate or posterior oropharyngeal erythema.  Cardiovascular:     Rate and Rhythm: Normal rate and regular rhythm.     Pulses: Normal pulses.     Heart sounds: Normal heart sounds. No murmur.  Pulmonary:     Effort: Pulmonary effort is normal. No respiratory distress.     Breath sounds: No wheezing or rhonchi.  Chest:     Chest wall: No tenderness.  Abdominal:     General: Abdomen is flat. Bowel sounds are normal. There is no distension.     Palpations: There is no mass.  Musculoskeletal:        General: Tenderness present. No swelling or deformity.     Comments: Neck pain present.  Limited range of motion with pain.  No crepitus tingling and numbness present.  Skin:    Capillary Refill: Capillary refill takes less than 2 seconds.  Neurological:     General: No focal deficit present.     Mental Status: He is alert and oriented to person, place, and time.     Cranial Nerves: Cranial nerves are intact.     Sensory: Sensation is intact.     Motor: Motor function is intact.     Coordination: Coordination is intact.     Gait: Gait is intact.     Deep Tendon Reflexes:     Reflex Scores:      Patellar reflexes are 2+ on the right side and 2+ on the left side.     UC Treatments / Results  Labs (all labs ordered are listed, but only abnormal results are displayed) Labs Reviewed - No data to display  EKG   Radiology No  results found.  Procedures Procedures (including critical care time)  Medications Ordered in UC Medications - No data to display  Initial Impression / Assessment and Plan / UC Course  I  have reviewed the triage vital signs and the nursing notes.  Pertinent labs & imaging results that were available during my care of the patient were reviewed by me and considered in my medical decision making (see chart for details).   Patient stable for discharge.  Benign physical exam.  Advised patient to use prednisone and Flexeril as prescribed.  To return for worsening of symptoms.  COVID-19 test was ordered for rule out. Work note was given.  Patient verbalized understanding of the plan of care.   Final Clinical Impressions(s) / UC Diagnoses   Final diagnoses:  Neck pain  COVID-19 ruled out     Discharge Instructions     Advised patient to take his medication as prescribed Advised patient that Flexeril may make him sleepy To use Flonase after completing prednisone To follow-up with primary care To return for worsening of symptoms  COVID-19 result will take about 2 to 7 days for results to be available Quarantine until results become available.     ED Prescriptions    Medication Sig Dispense Auth. Provider   predniSONE (DELTASONE) 10 MG tablet Take 2 tablets (20 mg total) by mouth daily. 15 tablet Hulda Reddix, Darrelyn Hillock, FNP   cyclobenzaprine (FLEXERIL) 10 MG tablet Take 1 tablet (10 mg total) by mouth 2 (two) times daily as needed for muscle spasms. 20 tablet Yeilyn Gent S, FNP   fluticasone (FLONASE) 50 MCG/ACT nasal spray Place 1 spray into both nostrils daily for 14 days. 16 g Emerson Monte, FNP     PDMP not reviewed this encounter.   Emerson Monte, [place] 06/13/19 1252

## 2019-06-13 NOTE — ED Triage Notes (Signed)
Pt presents to UC w/ c/o left ear and neck pain x3 days. Pt has tried aspirin, cold/ heat w/o relief. Left ear is slightly more red than right.

## 2019-06-15 LAB — NOVEL CORONAVIRUS, NAA: SARS-CoV-2, NAA: NOT DETECTED

## 2019-07-07 MED FILL — CYCLOBENZAPRINE HCL 10 MG T: 10 | 30 days supply | Qty: 60 | Fill #0

## 2019-07-07 MED FILL — traMADol HCL 50 MG TABS: 50 | 15 days supply | Qty: 120 | Fill #1

## 2019-07-20 ENCOUNTER — Encounter: Payer: Self-pay | Admitting: Family Medicine

## 2019-08-08 MED FILL — CYCLOBENZAPRINE HCL 10 MG T: 10 | 30 days supply | Qty: 60 | Fill #1

## 2019-08-08 MED FILL — traMADol HCL 50 MG TABS: 50 | 15 days supply | Qty: 120 | Fill #2

## 2019-09-05 DIAGNOSIS — G894 Chronic pain syndrome: Secondary | ICD-10-CM | POA: Diagnosis not present

## 2019-09-06 MED FILL — traMADol HCL 50 MG TABS: 50 | 15 days supply | Qty: 120 | Fill #0

## 2019-09-06 MED FILL — CYCLOBENZAPRINE HCL 10 MG T: 10 | 30 days supply | Qty: 60 | Fill #2

## 2019-09-16 MED FILL — buPROPion HCL ER (XL) 300 M: 300 | 90 days supply | Qty: 90 | Fill #0

## 2019-10-07 MED FILL — traMADol HCL 50 MG TABS: 50 | 15 days supply | Qty: 120 | Fill #1

## 2019-10-07 MED FILL — CYCLOBENZAPRINE HCL 10 MG T: 10 | 30 days supply | Qty: 60 | Fill #0

## 2019-11-04 MED FILL — CYCLOBENZAPRINE HCL 10 MG T: 10 | 30 days supply | Qty: 60 | Fill #1

## 2019-11-04 MED FILL — traMADol HCL 50 MG TABS: 50 | 15 days supply | Qty: 120 | Fill #0

## 2019-12-16 DIAGNOSIS — M1991 Primary osteoarthritis, unspecified site: Secondary | ICD-10-CM | POA: Diagnosis not present

## 2019-12-16 DIAGNOSIS — Z0001 Encounter for general adult medical examination with abnormal findings: Secondary | ICD-10-CM | POA: Diagnosis not present

## 2019-12-16 DIAGNOSIS — Z1389 Encounter for screening for other disorder: Secondary | ICD-10-CM | POA: Diagnosis not present

## 2019-12-16 DIAGNOSIS — R7309 Other abnormal glucose: Secondary | ICD-10-CM | POA: Diagnosis not present

## 2019-12-16 DIAGNOSIS — Z6829 Body mass index (BMI) 29.0-29.9, adult: Secondary | ICD-10-CM | POA: Diagnosis not present

## 2019-12-16 MED FILL — buPROPion HCL ER (XL) 300 M: 300 | 90 days supply | Qty: 90 | Fill #0

## 2019-12-16 MED FILL — MELOXICAM 15 MG TABLET: 15 | 90 days supply | Qty: 90 | Fill #0

## 2019-12-16 MED FILL — CYCLOBENZAPRINE HCL 10 MG T: 10 | 30 days supply | Qty: 60 | Fill #0

## 2019-12-16 MED FILL — traMADol HCL 50 MG TABS: 50 | 15 days supply | Qty: 120 | Fill #0

## 2019-12-26 DIAGNOSIS — R7309 Other abnormal glucose: Secondary | ICD-10-CM | POA: Diagnosis not present

## 2019-12-26 DIAGNOSIS — E7849 Other hyperlipidemia: Secondary | ICD-10-CM | POA: Diagnosis not present

## 2019-12-26 DIAGNOSIS — E663 Overweight: Secondary | ICD-10-CM | POA: Diagnosis not present

## 2019-12-26 DIAGNOSIS — Z1389 Encounter for screening for other disorder: Secondary | ICD-10-CM | POA: Diagnosis not present

## 2019-12-26 DIAGNOSIS — M179 Osteoarthritis of knee, unspecified: Secondary | ICD-10-CM | POA: Diagnosis not present

## 2019-12-26 DIAGNOSIS — Z0001 Encounter for general adult medical examination with abnormal findings: Secondary | ICD-10-CM | POA: Diagnosis not present
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
## 2020-01-09 DIAGNOSIS — Z1212 Encounter for screening for malignant neoplasm of rectum: Secondary | ICD-10-CM | POA: Diagnosis not present

## 2020-01-09 DIAGNOSIS — Z1211 Encounter for screening for malignant neoplasm of colon: Secondary | ICD-10-CM | POA: Diagnosis not present

## 2020-01-12 LAB — COLOGUARD: COLOGUARD: NEGATIVE

## 2020-01-18 MED FILL — CYCLOBENZAPRINE HCL 10 MG T: 10 | 30 days supply | Qty: 60 | Fill #1

## 2020-01-18 MED FILL — traMADol HCL 50 MG TABS: 50 | 15 days supply | Qty: 120 | Fill #1

## 2020-02-21 MED FILL — CYCLOBENZAPRINE HCL 10 MG T: 10 | 30 days supply | Qty: 60 | Fill #2

## 2020-02-21 MED FILL — traMADol HCL 50 MG TABS: 50 | 15 days supply | Qty: 120 | Fill #2

## 2020-03-21 ENCOUNTER — Other Ambulatory Visit (HOSPITAL_COMMUNITY): Payer: Self-pay | Admitting: Family Medicine

## 2020-03-21 DIAGNOSIS — G894 Chronic pain syndrome: Secondary | ICD-10-CM | POA: Diagnosis not present

## 2020-03-21 MED FILL — traMADol HCL 50 MG TABS: 50 | 15 days supply | Qty: 120 | Fill #0

## 2020-03-22 ENCOUNTER — Other Ambulatory Visit (HOSPITAL_COMMUNITY): Payer: Self-pay | Admitting: Family Medicine

## 2020-03-22 MED FILL — CYCLOBENZAPRINE HCL 10 MG T: 10 | 30 days supply | Qty: 60 | Fill #0

## 2020-04-23 MED FILL — CYCLOBENZAPRINE HCL 10 MG T: 10 | 30 days supply | Qty: 60 | Fill #1

## 2020-04-23 MED FILL — traMADol HCL 50 MG TABS: 50 | 15 days supply | Qty: 120 | Fill #1

## 2020-05-13 DIAGNOSIS — M25531 Pain in right wrist: Secondary | ICD-10-CM | POA: Diagnosis not present

## 2020-05-22 MED FILL — traMADol HCL 50 MG TABS: 50 | 15 days supply | Qty: 120 | Fill #2

## 2020-05-22 MED FILL — CYCLOBENZAPRINE HCL 10 MG T: 10 | 30 days supply | Qty: 60 | Fill #2

## 2020-06-26 ENCOUNTER — Other Ambulatory Visit (HOSPITAL_COMMUNITY): Payer: Self-pay | Admitting: Physician Assistant

## 2020-06-26 DIAGNOSIS — G894 Chronic pain syndrome: Secondary | ICD-10-CM | POA: Diagnosis not present

## 2020-06-26 DIAGNOSIS — Z1331 Encounter for screening for depression: Secondary | ICD-10-CM | POA: Diagnosis not present

## 2020-06-26 DIAGNOSIS — Z6829 Body mass index (BMI) 29.0-29.9, adult: Secondary | ICD-10-CM | POA: Diagnosis not present

## 2020-06-26 DIAGNOSIS — E663 Overweight: Secondary | ICD-10-CM | POA: Diagnosis not present

## 2020-06-26 DIAGNOSIS — M179 Osteoarthritis of knee, unspecified: Secondary | ICD-10-CM | POA: Diagnosis not present

## 2020-06-26 MED FILL — BENZONATATE 200 MG CAP: 200 | 30 days supply | Qty: 90 | Fill #0

## 2020-06-26 MED FILL — traMADol HCL 50 MG TABS: 50 | 15 days supply | Qty: 120 | Fill #0

## 2020-07-16 ENCOUNTER — Ambulatory Visit
Admission: EM | Admit: 2020-07-16 | Discharge: 2020-07-16 | Disposition: A | Discharge status: Home / Self Care | Payer: 59 | Attending: Family Medicine | Admitting: Family Medicine

## 2020-07-16 ENCOUNTER — Other Ambulatory Visit: Payer: Self-pay

## 2020-07-16 ENCOUNTER — Encounter: Payer: Self-pay | Admitting: Emergency Medicine

## 2020-07-16 DIAGNOSIS — J069 Acute upper respiratory infection, unspecified: Secondary | ICD-10-CM | POA: Diagnosis not present

## 2020-07-16 DIAGNOSIS — Z20822 Contact with and (suspected) exposure to covid-19: Secondary | ICD-10-CM | POA: Diagnosis not present

## 2020-07-16 MED ORDER — AZITHROMYCIN 250 MG PO TABS: 250.0000 mg | ORAL_TABLET | Freq: Every day | ORAL | 0 refills | Status: DC

## 2020-07-16 MED ORDER — PREDNISONE 10 MG (21) PO TBPK: ORAL_TABLET | Freq: Every day | ORAL | 0 refills | Status: AC

## 2020-07-16 NOTE — ED Provider Notes (Signed)
[place]   [id number] 07/16/20 Arrival Time: [id number]   CC: COVID symptoms  SUBJECTIVE: History from: patient.  Tyler Mack is a 55 y.o. male who presents with cough, body aches for the last 2 days. Denies sick exposure to COVID, flu or strep. Denies recent travel. Has negative history of Covid. Has  completed Covid vaccines. Has not taken OTC medications for this. There are no aggravating or alleviating factors. Denies previous symptoms in the past. Denies fever, chills, sinus pain, rhinorrhea, sore throat, SOB, wheezing, chest pain, nausea, changes in bowel or bladder habits.    ROS: As per HPI.  All other pertinent ROS negative.     Past Medical History:  Diagnosis Date  . Allergy   . GERD (gastroesophageal reflux disease)   . Restless legs syndrome    Past Surgical History:  Procedure Laterality Date  . KNEE ARTHROSCOPY    . KNEE SURGERY     Allergies  Allergen Reactions  . Penicillins Anaphylaxis, Swelling and Other (See Comments)    Did it involve swelling of the face/tongue/throat, SOB, or low BP? Yes Did it involve sudden or severe rash/hives, skin peeling, or any reaction on the inside of your mouth or nose? No Did you need to seek medical attention at a hospital or doctor's office? Yes When did it last happen?20 years If all above answers are "NO", may proceed with cephalosporin use.    No current facility-administered medications on file prior to encounter.   Current Outpatient Medications on File Prior to Encounter  Medication Sig Dispense Refill  . buPROPion (WELLBUTRIN XL) 300 MG 24 hr tablet Take 300 mg by mouth daily.     . cyclobenzaprine (FLEXERIL) 10 MG tablet Take 1 tablet (10 mg total) by mouth 2 (two) times daily as needed for muscle spasms. 20 tablet 0  . fluticasone (FLONASE) 50 MCG/ACT nasal spray Place 1 spray into both nostrils daily for 14 days. 16 g 0  . traMADol (ULTRAM) 50 MG tablet Take 1 tablet (50 mg total) by mouth every 6  (six) hours as needed. 30 tablet 5  . [DISCONTINUED] cetirizine (ZYRTEC) 10 MG tablet Take 1 tablet (10 mg total) daily by mouth. 30 tablet 11   Social History   Socioeconomic History  . Marital status: Married    Spouse name: Romie Minus  . Number of children: 2  . Years of education: Not on file  . Highest education level: Not on file  Occupational History  . Not on file  Tobacco Use  . Smoking status: Current Every Day Smoker    Packs/day: 0.50    Types: Cigarettes  . Smokeless tobacco: Never Used  Vaping Use  . Vaping Use: Never used  Substance and Sexual Activity  . Alcohol use: Yes    Alcohol/week: 2.0 standard drinks    Types: 2 Shots of liquor per week    Comment: every other weekend  . Drug use: No  . Sexual activity: Yes    Birth control/protection: Condom  Other Topics Concern  . Not on file  Social History Narrative  . Not on file   Social Determinants of Health   Financial Resource Strain: Not on file  Food Insecurity: Not on file  Transportation Needs: Not on file  Physical Activity: Not on file  Stress: Not on file  Social Connections: Not on file  Intimate Partner Violence: Not on file   Family History  Problem Relation Age of Onset  . Diabetes Mother   .  Stomach cancer Father   . Heart disease Brother   . Hyperlipidemia Brother   . Hypertension Brother   . Mental illness Brother   . Alcohol abuse Brother   . Drug abuse Brother   . Asthma Son   . Alcohol abuse Maternal Aunt   . Cancer Maternal Aunt   . Alcohol abuse Maternal Uncle   . Cancer Maternal Uncle     OBJECTIVE:  Vitals:   07/16/20 1020  BP: 124/87  Pulse: 90  Resp: 16  Temp: 97.9 F (36.6 C)  TempSrc: Oral  SpO2: 96%     General appearance: alert; appears fatigued, but nontoxic; speaking in full sentences and tolerating own secretions HEENT: NCAT; Ears: EACs clear, TMs pearly gray; lymphadenopathy to the right cervical and periauricular chains eyes: PERRL.  EOM grossly  intact. Sinuses: nontender; Nose: nares patent with clear rhinorrhea, Throat: oropharynx erythematous, cobblestoning present, tonsils non erythematous or enlarged, uvula midline  Neck: supple without LAD Lungs: unlabored respirations, symmetrical air entry; cough: mild; no respiratory distress; CTAB Heart: regular rate and rhythm.  Radial pulses 2+ symmetrical bilaterally Skin: warm and dry Psychological: alert and cooperative; normal mood and affect  LABS:  No results found for this or any previous visit (from the past 24 hour(s)).   ASSESSMENT & PLAN:  1. URI with cough and congestion   2. Exposure to COVID-19 virus     Meds ordered this encounter  Medications  . predniSONE (STERAPRED UNI-PAK 21 TAB) 10 MG (21) TBPK tablet    Sig: Take by mouth daily for 6 days. Take 6 tablets on day 1, 5 tablets on day 2, 4 tablets on day 3, 3 tablets on day 4, 2 tablets on day 5, 1 tablet on day 6    Dispense:  21 tablet    Refill:  0    Order Specific Question:   Supervising Provider    Answer:   Chase Picket [id number]  . azithromycin (ZITHROMAX) 250 MG tablet    Sig: Take 1 tablet (250 mg total) by mouth daily. Take first 2 tablets together, then 1 every day until finished.    Dispense:  6 tablet    Refill:  0    Order Specific Question:   Supervising Provider    Answer:   Chase Picket [id number]   Prescribed azithromycin Prescribe steroid taper Continue supportive care at home COVID and flu testing ordered.  It will take between 2-3 days for test results. Someone will contact you regarding abnormal results.   Work note provided Patient should remain in quarantine until they have received Covid results.  If negative you may resume normal activities (go back to work/school) while practicing hand hygiene, social distance, and mask wearing.  If positive, patient should remain in quarantine for at least 5 days from symptom onset AND greater than 72 hours after symptoms resolution  (absence of fever without the use of fever-reducing medication and improvement in respiratory symptoms), whichever is longer Get plenty of rest and push fluids Use OTC zyrtec for nasal congestion, runny nose, and/or sore throat Use OTC flonase for nasal congestion and runny nose Use medications daily for symptom relief Use OTC medications like ibuprofen or tylenol as needed fever or pain Call or go to the ED if you have any new or worsening symptoms such as fever, worsening cough, shortness of breath, chest tightness, chest pain, turning blue, changes in mental status.  Reviewed expectations re: course of current medical issues. Questions answered.  Outlined signs and symptoms indicating need for more acute intervention. Patient verbalized understanding. After Visit Summary given.         Faustino Congress, NP 07/16/20 1111

## 2020-07-16 NOTE — ED Triage Notes (Signed)
Body aches, cough, since saturday

## 2020-07-16 NOTE — Discharge Instructions (Addendum)
I have sent in azithromycin for you to take. Take 2 tablets today, then one tablet daily for the next 4 days.  I have sent in a prednisone taper for you to take for 6 days. 6 tablets on day one, 5 tablets on day two, 4 tablets on day three, 3 tablets on day four, 2 tablets on day five, and 1 tablet on day six.  Your COVID test is pending.  You should self quarantine until the test result is back.    Take Tylenol or ibuprofen as needed for fever or discomfort.  Rest and keep yourself hydrated.    Follow-up with your primary care provider if your symptoms are not improving.

## 2020-07-18 LAB — COVID-19, FLU A+B NAA
Influenza A, NAA: NOT DETECTED
Influenza B, NAA: NOT DETECTED
SARS-CoV-2, NAA: DETECTED — AB

## 2020-07-31 MED FILL — CYCLOBENZAPRINE HCL 10 MG T: 10 | 30 days supply | Qty: 60 | Fill #3

## 2020-07-31 MED FILL — traMADol HCL 50 MG TABS: 50 | 15 days supply | Qty: 120 | Fill #1

## 2020-09-06 ENCOUNTER — Other Ambulatory Visit (HOSPITAL_BASED_OUTPATIENT_CLINIC_OR_DEPARTMENT_OTHER): Payer: Self-pay

## 2020-10-03 ENCOUNTER — Other Ambulatory Visit (HOSPITAL_COMMUNITY): Payer: Self-pay

## 2020-10-03 DIAGNOSIS — M1991 Primary osteoarthritis, unspecified site: Secondary | ICD-10-CM | POA: Diagnosis not present

## 2020-10-03 DIAGNOSIS — G894 Chronic pain syndrome: Secondary | ICD-10-CM | POA: Diagnosis not present

## 2020-10-03 DIAGNOSIS — M179 Osteoarthritis of knee, unspecified: Secondary | ICD-10-CM | POA: Diagnosis not present

## 2020-10-03 MED ORDER — CYCLOBENZAPRINE HCL 10 MG PO TABS
1.0000 | ORAL_TABLET | Freq: Every evening | ORAL | 5 refills | Status: AC | PRN
  Filled 2020-10-03: qty 60, 30d supply, fill #0

## 2020-10-03 MED ORDER — TRAMADOL HCL 50 MG PO TABS
ORAL_TABLET | ORAL | 2 refills | Status: DC
  Filled 2020-10-03: qty 120, 15d supply, fill #0
  Filled 2020-11-01: qty 120, 15d supply, fill #1
  Filled 2020-12-05: qty 120, 15d supply, fill #2

## 2020-10-04 ENCOUNTER — Other Ambulatory Visit (HOSPITAL_COMMUNITY): Payer: Self-pay

## 2020-11-01 ENCOUNTER — Other Ambulatory Visit (HOSPITAL_COMMUNITY): Payer: Self-pay

## 2020-11-01 MED FILL — Cyclobenzaprine HCl Tab 10 MG: ORAL | 30 days supply | Qty: 60 | Fill #0 | Status: AC

## 2020-11-15 ENCOUNTER — Other Ambulatory Visit: Payer: Self-pay

## 2020-11-15 ENCOUNTER — Ambulatory Visit
Admission: EM | Admit: 2020-11-15 | Discharge: 2020-11-15 | Disposition: A | Discharge status: Home / Self Care | Payer: 59 | Attending: Internal Medicine | Admitting: Internal Medicine

## 2020-11-15 ENCOUNTER — Encounter: Payer: Self-pay | Admitting: Emergency Medicine

## 2020-11-15 DIAGNOSIS — J301 Allergic rhinitis due to pollen: Secondary | ICD-10-CM | POA: Diagnosis not present

## 2020-11-15 NOTE — Discharge Instructions (Addendum)
Your blood pressure from sitting to standing is droping 8 points which means you are a little dry and I advised you to push more fluids.  You may continue Claritin daily as needed

## 2020-11-15 NOTE — ED Triage Notes (Signed)
Nasal congestion and cough x 2 days.  States he feel dizzy today.  States he vomited yesterday.  States he has a headache since yesterday.

## 2020-11-15 NOTE — ED Provider Notes (Signed)
[facility]    CSN: [id number] Arrival date & time: 11/15/20  0940      History   Chief Complaint No chief complaint on file.   HPI Tyler Mack is a 55 y.o. male who presents with cough and nose congestion x 2 dasy. He had been out of town and he got back home and the cut grass is what is getting him. His boss sent him home today due to his symptoms. He got up this am and felt light headed and took Claritin and is feeling well now.     Past Medical History:  Diagnosis Date  . Allergy   . GERD (gastroesophageal reflux disease)   . Restless legs syndrome     There are no problems to display for this patient.   Past Surgical History:  Procedure Laterality Date  . KNEE ARTHROSCOPY    . KNEE SURGERY         Home Medications    Prior to Admission medications   Medication Sig Start Date End Date Taking? Authorizing Provider  buPROPion (WELLBUTRIN XL) 300 MG 24 hr tablet Take 300 mg by mouth daily.  10/15/18   [provider]  cyclobenzaprine (FLEXERIL) 10 MG tablet Take 1 tablet (10 mg total) by mouth 2 (two) times daily as needed for muscle spasms. 06/13/19   Avegno, Darrelyn Hillock, FNP  cyclobenzaprine (FLEXERIL) 10 MG tablet TAKE 1 TO 2 TABLETS BY MOUTH AT BEDTIME AS NEEDED. 03/22/20 03/22/21  Jake Samples, PA-C  cyclobenzaprine (FLEXERIL) 10 MG tablet TAKE 1 TO 2 TABLETS BY MOUTH AT BEDTIME AS NEEDED. 10/03/20     fluticasone (FLONASE) 50 MCG/ACT nasal spray Place 1 spray into both nostrils daily for 14 days. 06/13/19 06/27/19  Avegno, Darrelyn Hillock, FNP  traMADol (ULTRAM) 50 MG tablet TAKE 1 TO 2 TABLETS BY MOUTH 4 TIMES DAILY 06/26/20 12/23/20  Cory Munch, PA-C  traMADol (ULTRAM) 50 MG tablet Take 1 to 2 tablets by mouth 4 times daily. 10/03/20     cetirizine (ZYRTEC) 10 MG tablet Take 1 tablet (10 mg total) daily by mouth. 04/28/17 06/13/19  Jaynee Eagles, PA-C    Family History Family History  Problem Relation Age of Onset  . Diabetes Mother    . Stomach cancer Father   . Heart disease Brother   . Hyperlipidemia Brother   . Hypertension Brother   . Mental illness Brother   . Alcohol abuse Brother   . Drug abuse Brother   . Asthma Son   . Alcohol abuse Maternal Aunt   . Cancer Maternal Aunt   . Alcohol abuse Maternal Uncle   . Cancer Maternal Uncle     Social History Social History   Tobacco Use  . Smoking status: Current Every Day Smoker    Packs/day: 0.50    Types: Cigarettes  . Smokeless tobacco: Never Used  Vaping Use  . Vaping Use: Never used  Substance Use Topics  . Alcohol use: Yes    Alcohol/week: 2.0 standard drinks    Types: 2 Shots of liquor per week    Comment: every other weekend  . Drug use: No     Allergies   Penicillins   Review of Systems Review of Systems  Constitutional: Negative for activity change, appetite change, chills, diaphoresis, fatigue and fever.  HENT: Positive for congestion, postnasal drip and rhinorrhea. Negative for ear pain, facial swelling and sore throat.   Eyes: Negative for discharge.  Respiratory: Positive for cough. Negative  for chest tightness and shortness of breath.   Cardiovascular: Negative for chest pain.  Musculoskeletal: Negative for myalgias.  Skin: Negative for rash.  Neurological: Positive for dizziness and headaches. Negative for weakness.       On R forehead  Hematological: Negative for adenopathy.     Physical Exam Triage Vital Signs ED Triage Vitals  Enc Vitals Group     BP 11/15/20 1018 129/87     Pulse Rate 11/15/20 1018 87     Resp 11/15/20 1018 16     Temp 11/15/20 1018 98.6 F (37 C)     Temp Source 11/15/20 1018 Oral     SpO2 11/15/20 1018 98 %     Weight --      Height --      Head Circumference --      Peak Flow --      Pain Score 11/15/20 1025 4     Pain Loc --      Pain Edu? --      Excl. in [place]? --    Orthostatic VS for the past 24 hrs:  BP- Lying Pulse- Lying BP- Sitting Pulse- Sitting BP- Standing at 0 minutes Pulse-  Standing at 0 minutes  11/15/20 1050 131/87 79 132/90 85 124/86 89    Updated Vital Signs BP 129/87 (BP Location: Right Arm)   Pulse 87   Temp 98.6 F (37 C) (Oral)   Resp 16   SpO2 98%   Visual Acuity Right Eye Distance:   Left Eye Distance:   Bilateral Distance:    Right Eye Near:   Left Eye Near:    Bilateral Near:     Physical Exam Alert pt NAD who seems nasally congested EYES- non icterus, mild watering, no purulent drainage NOSE- moderate mucosa congestion which is pale pink with clear mucous. No sinus tenderness TM- both gray and little dull, canals are normal PHARYNX- clear, clear drainage noted.  NECK- supple with no nodes. No carotid bruits heard LUNGS- clear HEART - RRR with no murmurs SKIN- non jaundiced, no rashes.    UC Treatments / Results  Labs (all labs ordered are listed, but only abnormal results are displayed) Labs Reviewed - No data to display  EKG   Radiology No results found.  Procedures Procedures (including critical care time)  Medications Ordered in UC Medications - No data to display  Initial Impression / Assessment and Plan / UC Course  I have reviewed the triage vital signs and the nursing notes. Has allergic rhinitis with mild dehydration. Advised to push fluids and may continue the Claritin.  Final Clinical Impressions(s) / UC Diagnoses   Final diagnoses:  Seasonal allergic rhinitis due to pollen     Discharge Instructions     Your blood pressure from sitting to standing is droping 8 points which means you are a little dry and I advised you to push more fluids.  You may continue Claritin daily as needed     ED Prescriptions    None     PDMP not reviewed this encounter.   Shelby Mattocks, PA-C 11/15/20 1103

## 2020-12-05 ENCOUNTER — Other Ambulatory Visit (HOSPITAL_COMMUNITY): Payer: Self-pay

## 2020-12-05 ENCOUNTER — Other Ambulatory Visit: Payer: Self-pay

## 2020-12-06 ENCOUNTER — Other Ambulatory Visit (HOSPITAL_COMMUNITY): Payer: Self-pay

## 2020-12-06 MED ORDER — CYCLOBENZAPRINE HCL 10 MG PO TABS
1.0000 | ORAL_TABLET | Freq: Every evening | ORAL | 3 refills | Status: DC | PRN
  Filled 2020-12-06: qty 60, 30d supply, fill #0

## 2021-01-07 ENCOUNTER — Other Ambulatory Visit (HOSPITAL_COMMUNITY): Payer: Self-pay

## 2021-01-07 MED ORDER — TRAMADOL HCL 50 MG PO TABS
ORAL_TABLET | ORAL | 0 refills | Status: DC
  Filled 2021-01-07: qty 120, 15d supply, fill #0

## 2021-01-14 ENCOUNTER — Other Ambulatory Visit (HOSPITAL_COMMUNITY): Payer: Self-pay

## 2021-01-14 DIAGNOSIS — M179 Osteoarthritis of knee, unspecified: Secondary | ICD-10-CM | POA: Diagnosis not present

## 2021-01-14 DIAGNOSIS — G894 Chronic pain syndrome: Secondary | ICD-10-CM | POA: Diagnosis not present

## 2021-01-14 DIAGNOSIS — E663 Overweight: Secondary | ICD-10-CM | POA: Diagnosis not present

## 2021-01-14 DIAGNOSIS — M71331 Other bursal cyst, right wrist: Secondary | ICD-10-CM | POA: Diagnosis not present

## 2021-01-14 DIAGNOSIS — Z6828 Body mass index (BMI) 28.0-28.9, adult: Secondary | ICD-10-CM | POA: Diagnosis not present

## 2021-01-14 MED ORDER — CYCLOBENZAPRINE HCL 10 MG PO TABS
10.0000 mg | ORAL_TABLET | Freq: Every evening | ORAL | 3 refills | Status: AC | PRN
  Filled 2021-01-14: qty 60, 30d supply, fill #0
  Filled 2021-02-21: qty 60, 30d supply, fill #1
  Filled 2021-03-25: qty 60, 30d supply, fill #2

## 2021-01-14 MED ORDER — TRAMADOL HCL 50 MG PO TABS
ORAL_TABLET | ORAL | 2 refills | Status: DC
  Filled 2021-01-14: qty 120, 30d supply, fill #0
  Filled 2021-02-21: qty 120, 30d supply, fill #1
  Filled 2021-03-25: qty 120, 15d supply, fill #2

## 2021-01-21 ENCOUNTER — Other Ambulatory Visit (HOSPITAL_COMMUNITY): Payer: Self-pay

## 2021-01-25 DIAGNOSIS — R2233 Localized swelling, mass and lump, upper limb, bilateral: Secondary | ICD-10-CM | POA: Diagnosis not present

## 2021-02-04 ENCOUNTER — Other Ambulatory Visit (HOSPITAL_COMMUNITY): Payer: Self-pay

## 2021-02-04 DIAGNOSIS — L309 Dermatitis, unspecified: Secondary | ICD-10-CM | POA: Diagnosis not present

## 2021-02-04 DIAGNOSIS — L920 Granuloma annulare: Secondary | ICD-10-CM | POA: Diagnosis not present

## 2021-02-04 DIAGNOSIS — D485 Neoplasm of uncertain behavior of skin: Secondary | ICD-10-CM | POA: Diagnosis not present

## 2021-02-04 MED ORDER — BETAMETHASONE DIPROPIONATE 0.05 % EX CREA
TOPICAL_CREAM | CUTANEOUS | 1 refills | Status: AC
  Filled 2021-02-04: qty 45, 21d supply, fill #0

## 2021-02-21 ENCOUNTER — Other Ambulatory Visit (HOSPITAL_COMMUNITY): Payer: Self-pay

## 2021-02-28 ENCOUNTER — Other Ambulatory Visit (HOSPITAL_COMMUNITY): Payer: Self-pay | Admitting: Orthopedic Surgery

## 2021-03-05 ENCOUNTER — Other Ambulatory Visit: Payer: Self-pay | Admitting: Orthopedic Surgery

## 2021-03-05 DIAGNOSIS — R2233 Localized swelling, mass and lump, upper limb, bilateral: Secondary | ICD-10-CM

## 2021-03-25 ENCOUNTER — Other Ambulatory Visit (HOSPITAL_COMMUNITY): Payer: Self-pay

## 2021-04-13 DIAGNOSIS — H5213 Myopia, bilateral: Secondary | ICD-10-CM | POA: Diagnosis not present

## 2021-04-15 ENCOUNTER — Other Ambulatory Visit (HOSPITAL_COMMUNITY): Payer: Self-pay

## 2021-04-15 DIAGNOSIS — L920 Granuloma annulare: Secondary | ICD-10-CM | POA: Diagnosis not present

## 2021-04-15 MED ORDER — TACROLIMUS 0.1 % EX OINT
TOPICAL_OINTMENT | CUTANEOUS | 2 refills | Status: AC
  Filled 2021-04-15 – 2021-04-16 (×2): qty 60, 30d supply, fill #0

## 2021-04-16 ENCOUNTER — Other Ambulatory Visit (HOSPITAL_COMMUNITY): Payer: Self-pay

## 2021-04-17 ENCOUNTER — Other Ambulatory Visit (HOSPITAL_COMMUNITY): Payer: Self-pay

## 2021-04-29 ENCOUNTER — Other Ambulatory Visit (HOSPITAL_COMMUNITY): Payer: Self-pay

## 2021-04-29 DIAGNOSIS — F411 Generalized anxiety disorder: Secondary | ICD-10-CM | POA: Diagnosis not present

## 2021-04-29 DIAGNOSIS — J019 Acute sinusitis, unspecified: Secondary | ICD-10-CM | POA: Diagnosis not present

## 2021-04-29 DIAGNOSIS — G894 Chronic pain syndrome: Secondary | ICD-10-CM | POA: Diagnosis not present

## 2021-04-29 DIAGNOSIS — G4701 Insomnia due to medical condition: Secondary | ICD-10-CM | POA: Diagnosis not present

## 2021-04-29 MED ORDER — TRAMADOL HCL 50 MG PO TABS
ORAL_TABLET | ORAL | 2 refills | Status: DC
  Filled 2021-04-29: qty 120, 30d supply, fill #0
  Filled 2021-05-28: qty 120, 30d supply, fill #1
  Filled 2021-06-27: qty 120, 30d supply, fill #2

## 2021-04-29 MED ORDER — BUPROPION HCL ER (XL) 300 MG PO TB24
ORAL_TABLET | ORAL | 3 refills | Status: AC
  Filled 2021-04-29: qty 90, 90d supply, fill #0

## 2021-04-29 MED ORDER — CYCLOBENZAPRINE HCL 10 MG PO TABS
ORAL_TABLET | ORAL | 2 refills | Status: DC
  Filled 2021-04-29: qty 60, 20d supply, fill #0
  Filled 2021-05-28: qty 60, 20d supply, fill #1
  Filled 2021-06-27: qty 60, 20d supply, fill #2

## 2021-04-29 MED ORDER — AZITHROMYCIN 250 MG PO TABS
ORAL_TABLET | ORAL | 0 refills | Status: DC
  Filled 2021-04-29: qty 6, 5d supply, fill #0

## 2021-04-29 MED ORDER — METHYLPREDNISOLONE 4 MG PO TBPK
ORAL_TABLET | ORAL | 0 refills | Status: DC
  Filled 2021-04-29: qty 21, 6d supply, fill #0

## 2021-05-21 DIAGNOSIS — M13831 Other specified arthritis, right wrist: Secondary | ICD-10-CM | POA: Diagnosis not present

## 2021-05-27 DIAGNOSIS — R208 Other disturbances of skin sensation: Secondary | ICD-10-CM | POA: Diagnosis not present

## 2021-05-27 DIAGNOSIS — L920 Granuloma annulare: Secondary | ICD-10-CM | POA: Diagnosis not present

## 2021-05-27 DIAGNOSIS — L538 Other specified erythematous conditions: Secondary | ICD-10-CM | POA: Diagnosis not present

## 2021-05-28 ENCOUNTER — Other Ambulatory Visit (HOSPITAL_COMMUNITY): Payer: Self-pay

## 2021-06-27 ENCOUNTER — Other Ambulatory Visit (HOSPITAL_COMMUNITY): Payer: Self-pay

## 2021-06-28 ENCOUNTER — Other Ambulatory Visit (HOSPITAL_COMMUNITY): Payer: Self-pay

## 2021-08-05 ENCOUNTER — Ambulatory Visit: Payer: 59 | Admitting: Dermatology

## 2021-08-06 ENCOUNTER — Other Ambulatory Visit (HOSPITAL_COMMUNITY): Payer: Self-pay

## 2021-08-06 DIAGNOSIS — B078 Other viral warts: Secondary | ICD-10-CM | POA: Diagnosis not present

## 2021-08-06 DIAGNOSIS — M179 Osteoarthritis of knee, unspecified: Secondary | ICD-10-CM | POA: Diagnosis not present

## 2021-08-06 DIAGNOSIS — G894 Chronic pain syndrome: Secondary | ICD-10-CM | POA: Diagnosis not present

## 2021-08-06 MED ORDER — TRAMADOL HCL 50 MG PO TABS
ORAL_TABLET | ORAL | 2 refills | Status: DC
  Filled 2021-08-06: qty 120, 30d supply, fill #0
  Filled 2021-09-03: qty 120, 30d supply, fill #1
  Filled 2021-10-04: qty 120, 30d supply, fill #2

## 2021-08-06 MED ORDER — PODOFILOX 0.5 % EX SOLN
CUTANEOUS | 11 refills | Status: DC
  Filled 2021-08-06: qty 3.5, 30d supply, fill #0

## 2021-08-06 MED ORDER — CYCLOBENZAPRINE HCL 10 MG PO TABS
ORAL_TABLET | ORAL | 5 refills | Status: DC
  Filled 2021-08-06: qty 60, 30d supply, fill #0
  Filled 2021-09-03: qty 60, 20d supply, fill #1
  Filled 2021-10-04: qty 60, 20d supply, fill #2
  Filled 2021-10-30: qty 60, 20d supply, fill #3
  Filled 2022-07-06: qty 60, 20d supply, fill #4

## 2021-08-07 ENCOUNTER — Other Ambulatory Visit (HOSPITAL_COMMUNITY): Payer: Self-pay

## 2021-08-08 ENCOUNTER — Other Ambulatory Visit (HOSPITAL_COMMUNITY): Payer: Self-pay

## 2021-09-03 ENCOUNTER — Other Ambulatory Visit (HOSPITAL_COMMUNITY): Payer: Self-pay

## 2021-09-04 ENCOUNTER — Other Ambulatory Visit (HOSPITAL_COMMUNITY): Payer: Self-pay

## 2021-10-04 ENCOUNTER — Other Ambulatory Visit (HOSPITAL_COMMUNITY): Payer: Self-pay

## 2021-10-30 ENCOUNTER — Other Ambulatory Visit (HOSPITAL_COMMUNITY): Payer: Self-pay

## 2021-10-30 MED ORDER — TRAMADOL HCL 50 MG PO TABS
ORAL_TABLET | ORAL | 2 refills | Status: DC
  Filled 2021-10-30 – 2021-11-04 (×2): qty 120, 30d supply, fill #0
  Filled 2021-12-09: qty 120, 30d supply, fill #1
  Filled 2022-01-16: qty 120, 30d supply, fill #2

## 2021-11-04 ENCOUNTER — Other Ambulatory Visit (HOSPITAL_COMMUNITY): Payer: Self-pay

## 2021-11-05 ENCOUNTER — Other Ambulatory Visit (HOSPITAL_COMMUNITY): Payer: Self-pay

## 2021-12-09 ENCOUNTER — Other Ambulatory Visit (HOSPITAL_COMMUNITY): Payer: Self-pay

## 2022-01-16 ENCOUNTER — Other Ambulatory Visit (HOSPITAL_COMMUNITY): Payer: Self-pay

## 2022-01-29 ENCOUNTER — Other Ambulatory Visit (HOSPITAL_COMMUNITY): Payer: Self-pay

## 2022-01-29 DIAGNOSIS — M1991 Primary osteoarthritis, unspecified site: Secondary | ICD-10-CM | POA: Diagnosis not present

## 2022-01-29 DIAGNOSIS — Z6826 Body mass index (BMI) 26.0-26.9, adult: Secondary | ICD-10-CM | POA: Diagnosis not present

## 2022-01-29 DIAGNOSIS — Z1331 Encounter for screening for depression: Secondary | ICD-10-CM | POA: Diagnosis not present

## 2022-01-29 DIAGNOSIS — E785 Hyperlipidemia, unspecified: Secondary | ICD-10-CM | POA: Diagnosis not present

## 2022-01-29 DIAGNOSIS — Z0001 Encounter for general adult medical examination with abnormal findings: Secondary | ICD-10-CM | POA: Diagnosis not present

## 2022-01-29 DIAGNOSIS — G894 Chronic pain syndrome: Secondary | ICD-10-CM | POA: Diagnosis not present

## 2022-01-29 MED ORDER — CYCLOBENZAPRINE HCL 10 MG PO TABS
ORAL_TABLET | ORAL | 5 refills | Status: AC
  Filled 2022-01-29: qty 60, 20d supply, fill #0
  Filled 2022-05-05: qty 60, 20d supply, fill #1
  Filled 2022-06-03 (×2): qty 60, 20d supply, fill #2
  Filled 2022-09-01: qty 60, 20d supply, fill #3
  Filled 2022-10-03: qty 60, 20d supply, fill #4
  Filled 2022-11-10: qty 60, 20d supply, fill #5

## 2022-01-29 MED ORDER — TRAMADOL HCL 50 MG PO TABS
ORAL_TABLET | ORAL | 2 refills | Status: DC
  Filled 2022-01-29 – 2022-03-05 (×2): qty 120, 30d supply, fill #0
  Filled 2022-04-03: qty 120, 30d supply, fill #1
  Filled 2022-05-05: qty 120, 30d supply, fill #2

## 2022-01-29 MED ORDER — BUPROPION HCL ER (XL) 300 MG PO TB24
ORAL_TABLET | ORAL | 3 refills | Status: DC
  Filled 2022-01-29: qty 90, 90d supply, fill #0
  Filled 2022-06-03 (×2): qty 90, 90d supply, fill #1
  Filled 2022-09-01: qty 90, 90d supply, fill #2
  Filled 2022-10-03 – 2023-01-13 (×2): qty 90, 90d supply, fill #3

## 2022-02-06 ENCOUNTER — Other Ambulatory Visit (HOSPITAL_COMMUNITY): Payer: Self-pay

## 2022-02-06 MED ORDER — ROSUVASTATIN CALCIUM 10 MG PO TABS
10.0000 mg | ORAL_TABLET | Freq: Every day | ORAL | 2 refills | Status: DC
  Filled 2022-02-06: qty 90, 90d supply, fill #0
  Filled 2022-07-06: qty 90, 90d supply, fill #1
  Filled 2022-10-03: qty 90, 90d supply, fill #2

## 2022-02-07 ENCOUNTER — Other Ambulatory Visit (HOSPITAL_COMMUNITY): Payer: Self-pay

## 2022-02-19 ENCOUNTER — Other Ambulatory Visit (HOSPITAL_COMMUNITY): Payer: Self-pay

## 2022-02-19 DIAGNOSIS — G5603 Carpal tunnel syndrome, bilateral upper limbs: Secondary | ICD-10-CM | POA: Diagnosis not present

## 2022-02-19 MED ORDER — CELECOXIB 200 MG PO CAPS
200.0000 mg | ORAL_CAPSULE | ORAL | 0 refills | Status: DC
  Filled 2022-02-19: qty 30, 30d supply, fill #0

## 2022-03-05 ENCOUNTER — Other Ambulatory Visit (HOSPITAL_COMMUNITY): Payer: Self-pay

## 2022-03-12 DIAGNOSIS — G5613 Other lesions of median nerve, bilateral upper limbs: Secondary | ICD-10-CM | POA: Diagnosis not present

## 2022-03-12 DIAGNOSIS — G5603 Carpal tunnel syndrome, bilateral upper limbs: Secondary | ICD-10-CM | POA: Diagnosis not present

## 2022-03-13 ENCOUNTER — Other Ambulatory Visit: Payer: Self-pay | Admitting: Orthopedic Surgery

## 2022-03-28 ENCOUNTER — Encounter (HOSPITAL_BASED_OUTPATIENT_CLINIC_OR_DEPARTMENT_OTHER): Payer: Self-pay | Admitting: Orthopedic Surgery

## 2022-03-28 ENCOUNTER — Other Ambulatory Visit: Payer: Self-pay

## 2022-04-03 ENCOUNTER — Other Ambulatory Visit (HOSPITAL_COMMUNITY): Payer: Self-pay

## 2022-04-04 ENCOUNTER — Other Ambulatory Visit (HOSPITAL_COMMUNITY): Payer: Self-pay

## 2022-04-07 ENCOUNTER — Other Ambulatory Visit (HOSPITAL_COMMUNITY): Payer: Self-pay

## 2022-04-07 NOTE — Progress Notes (Signed)

## 2022-04-08 ENCOUNTER — Other Ambulatory Visit (HOSPITAL_COMMUNITY): Payer: Self-pay

## 2022-04-08 ENCOUNTER — Encounter (HOSPITAL_BASED_OUTPATIENT_CLINIC_OR_DEPARTMENT_OTHER): Payer: Self-pay | Admitting: Orthopedic Surgery

## 2022-04-08 ENCOUNTER — Ambulatory Visit (HOSPITAL_BASED_OUTPATIENT_CLINIC_OR_DEPARTMENT_OTHER)
Admission: RE | Admit: 2022-04-08 | Discharge: 2022-04-08 | Disposition: A | Discharge status: Home / Self Care | Payer: 59 | Attending: Orthopedic Surgery | Admitting: Orthopedic Surgery

## 2022-04-08 ENCOUNTER — Ambulatory Visit (HOSPITAL_BASED_OUTPATIENT_CLINIC_OR_DEPARTMENT_OTHER): Payer: 59 | Admitting: Anesthesiology

## 2022-04-08 ENCOUNTER — Encounter (HOSPITAL_BASED_OUTPATIENT_CLINIC_OR_DEPARTMENT_OTHER)
Admission: RE | Disposition: A | Discharge status: Home / Self Care | Payer: Self-pay | Source: Home / Self Care | Attending: Orthopedic Surgery

## 2022-04-08 ENCOUNTER — Other Ambulatory Visit: Payer: Self-pay

## 2022-04-08 DIAGNOSIS — K219 Gastro-esophageal reflux disease without esophagitis: Secondary | ICD-10-CM | POA: Diagnosis not present

## 2022-04-08 DIAGNOSIS — M67431 Ganglion, right wrist: Secondary | ICD-10-CM | POA: Insufficient documentation

## 2022-04-08 DIAGNOSIS — Z79899 Other long term (current) drug therapy: Secondary | ICD-10-CM | POA: Insufficient documentation

## 2022-04-08 DIAGNOSIS — G5603 Carpal tunnel syndrome, bilateral upper limbs: Secondary | ICD-10-CM | POA: Insufficient documentation

## 2022-04-08 DIAGNOSIS — Z01818 Encounter for other preprocedural examination: Secondary | ICD-10-CM

## 2022-04-08 HISTORY — PX: GANGLION CYST EXCISION: SHX1691

## 2022-04-08 HISTORY — PX: CARPAL TUNNEL RELEASE: SHX101

## 2022-04-08 SURGERY — CARPAL TUNNEL RELEASE
Anesthesia: General | Site: Wrist | Laterality: Bilateral

## 2022-04-08 MED ORDER — BUPIVACAINE HCL (PF) 0.25 % IJ SOLN
INTRAMUSCULAR | Status: AC
  Filled 2022-04-08: qty 30

## 2022-04-08 MED ORDER — FENTANYL CITRATE (PF) 100 MCG/2ML IJ SOLN
INTRAMUSCULAR | Status: AC
  Filled 2022-04-08: qty 2

## 2022-04-08 MED ORDER — VANCOMYCIN HCL IN DEXTROSE 1-5 GM/200ML-% IV SOLN
1000.0000 mg | INTRAVENOUS | Status: AC
  Administered 2022-04-08: 1000 mg via INTRAVENOUS

## 2022-04-08 MED ORDER — CELECOXIB 200 MG PO CAPS
ORAL_CAPSULE | ORAL | Status: AC
  Filled 2022-04-08: qty 1

## 2022-04-08 MED ORDER — DEXAMETHASONE SODIUM PHOSPHATE 10 MG/ML IJ SOLN
INTRAMUSCULAR | Status: AC
  Filled 2022-04-08: qty 1

## 2022-04-08 MED ORDER — OXYCODONE HCL 5 MG PO TABS: 5.0000 mg | ORAL_TABLET | Freq: Once | ORAL | Status: DC | PRN

## 2022-04-08 MED ORDER — MIDAZOLAM HCL 2 MG/2ML IJ SOLN
INTRAMUSCULAR | Status: AC
  Filled 2022-04-08: qty 2

## 2022-04-08 MED ORDER — PROPOFOL 10 MG/ML IV BOLUS
INTRAVENOUS | Status: DC | PRN
  Administered 2022-04-08: 150 mg via INTRAVENOUS
  Administered 2022-04-08: 20 mg via INTRAVENOUS

## 2022-04-08 MED ORDER — ONDANSETRON HCL 4 MG/2ML IJ SOLN
INTRAMUSCULAR | Status: AC
  Filled 2022-04-08: qty 2

## 2022-04-08 MED ORDER — LIDOCAINE 2% (20 MG/ML) 5 ML SYRINGE
INTRAMUSCULAR | Status: AC
  Filled 2022-04-08: qty 5

## 2022-04-08 MED ORDER — MIDAZOLAM HCL 5 MG/5ML IJ SOLN
INTRAMUSCULAR | Status: DC | PRN
  Administered 2022-04-08: 2 mg via INTRAVENOUS

## 2022-04-08 MED ORDER — ACETAMINOPHEN 500 MG PO TABS
1000.0000 mg | ORAL_TABLET | Freq: Once | ORAL | Status: AC
  Administered 2022-04-08: 1000 mg via ORAL

## 2022-04-08 MED ORDER — FENTANYL CITRATE (PF) 100 MCG/2ML IJ SOLN
INTRAMUSCULAR | Status: DC | PRN
  Administered 2022-04-08 (×4): 50 ug via INTRAVENOUS

## 2022-04-08 MED ORDER — MEPERIDINE HCL 25 MG/ML IJ SOLN: 6.2500 mg | INTRAMUSCULAR | Status: DC | PRN

## 2022-04-08 MED ORDER — HYDROMORPHONE HCL 1 MG/ML IJ SOLN
INTRAMUSCULAR | Status: AC
  Filled 2022-04-08: qty 0.5

## 2022-04-08 MED ORDER — LACTATED RINGERS IV SOLN: INTRAVENOUS | Status: DC

## 2022-04-08 MED ORDER — ACETAMINOPHEN 500 MG PO TABS
ORAL_TABLET | ORAL | Status: AC
  Filled 2022-04-08: qty 2

## 2022-04-08 MED ORDER — ACETAMINOPHEN 500 MG PO TABS
ORAL_TABLET | ORAL | Status: AC
  Filled 2022-04-08: qty 1

## 2022-04-08 MED ORDER — HYDROCODONE-ACETAMINOPHEN 5-325 MG PO TABS
1.0000 | ORAL_TABLET | Freq: Four times a day (QID) | ORAL | 0 refills | Status: DC | PRN
  Filled 2022-04-08: qty 20, 5d supply, fill #0

## 2022-04-08 MED ORDER — ONDANSETRON HCL 4 MG/2ML IJ SOLN
INTRAMUSCULAR | Status: DC | PRN
  Administered 2022-04-08: 4 mg via INTRAVENOUS

## 2022-04-08 MED ORDER — CELECOXIB 200 MG PO CAPS
200.0000 mg | ORAL_CAPSULE | Freq: Once | ORAL | Status: AC
  Administered 2022-04-08: 200 mg via ORAL

## 2022-04-08 MED ORDER — PROPOFOL 500 MG/50ML IV EMUL
INTRAVENOUS | Status: DC | PRN
  Administered 2022-04-08: 25 ug/kg/min via INTRAVENOUS

## 2022-04-08 MED ORDER — DEXAMETHASONE SODIUM PHOSPHATE 4 MG/ML IJ SOLN
INTRAMUSCULAR | Status: DC | PRN
  Administered 2022-04-08: 10 mg via INTRAVENOUS

## 2022-04-08 MED ORDER — OXYCODONE HCL 5 MG/5ML PO SOLN: 5.0000 mg | Freq: Once | ORAL | Status: DC | PRN

## 2022-04-08 MED ORDER — VANCOMYCIN HCL IN DEXTROSE 1-5 GM/200ML-% IV SOLN
INTRAVENOUS | Status: AC
  Filled 2022-04-08: qty 200

## 2022-04-08 MED ORDER — 0.9 % SODIUM CHLORIDE (POUR BTL) OPTIME
TOPICAL | Status: DC | PRN
  Administered 2022-04-08: 150 mL

## 2022-04-08 MED ORDER — HYDROMORPHONE HCL 1 MG/ML IJ SOLN
0.2500 mg | INTRAMUSCULAR | Status: DC | PRN
  Administered 2022-04-08: 0.5 mg via INTRAVENOUS

## 2022-04-08 MED ORDER — LIDOCAINE 2% (20 MG/ML) 5 ML SYRINGE
INTRAMUSCULAR | Status: DC | PRN
  Administered 2022-04-08: 100 mg via INTRAVENOUS

## 2022-04-08 MED ORDER — BUPIVACAINE HCL (PF) 0.25 % IJ SOLN
INTRAMUSCULAR | Status: DC | PRN
  Administered 2022-04-08: 9 mL

## 2022-04-08 MED ORDER — PROMETHAZINE HCL 25 MG/ML IJ SOLN: 6.2500 mg | INTRAMUSCULAR | Status: DC | PRN

## 2022-04-08 SURGICAL SUPPLY — 38 items
ADH SKN CLS APL DERMABOND .7 (GAUZE/BANDAGES/DRESSINGS) ×4
APL PRP STRL LF DISP 70% ISPRP (MISCELLANEOUS) ×4
BLADE SURG 15 STRL LF DISP TIS (BLADE) ×4 IMPLANT
BLADE SURG 15 STRL SS (BLADE) ×8
BNDG CMPR 9X4 STRL LF SNTH (GAUZE/BANDAGES/DRESSINGS) ×2
BNDG ELASTIC 3X5.8 VLCR STR LF (GAUZE/BANDAGES/DRESSINGS) ×2 IMPLANT
BNDG ESMARK 4X9 LF (GAUZE/BANDAGES/DRESSINGS) IMPLANT
BNDG GAUZE DERMACEA FLUFF 4 (GAUZE/BANDAGES/DRESSINGS) ×2 IMPLANT
BNDG GZE DERMACEA 4 6PLY (GAUZE/BANDAGES/DRESSINGS) ×4
CHLORAPREP W/TINT 26 (MISCELLANEOUS) ×2 IMPLANT
CORD BIPOLAR FORCEPS 12FT (ELECTRODE) ×2 IMPLANT
COVER BACK TABLE 60X90IN (DRAPES) ×2 IMPLANT
COVER MAYO STAND STRL (DRAPES) ×2 IMPLANT
CUFF TOURN SGL QUICK 18X4 (TOURNIQUET CUFF) ×2 IMPLANT
DERMABOND ADVANCED .7 DNX12 (GAUZE/BANDAGES/DRESSINGS) IMPLANT
DRAPE EXTREMITY T 121X128X90 (DISPOSABLE) ×2 IMPLANT
DRAPE SURG 17X23 STRL (DRAPES) ×2 IMPLANT
GAUZE PAD ABD 8X10 STRL (GAUZE/BANDAGES/DRESSINGS) ×2 IMPLANT
GAUZE SPONGE 4X4 12PLY STRL (GAUZE/BANDAGES/DRESSINGS) ×2 IMPLANT
GAUZE XEROFORM 1X8 LF (GAUZE/BANDAGES/DRESSINGS) ×2 IMPLANT
GLOVE BIO SURGEON STRL SZ7.5 (GLOVE) ×2 IMPLANT
GLOVE BIOGEL PI IND STRL 8 (GLOVE) ×2 IMPLANT
GOWN STRL REUS W/ TWL LRG LVL3 (GOWN DISPOSABLE) ×2 IMPLANT
GOWN STRL REUS W/TWL LRG LVL3 (GOWN DISPOSABLE) ×2
GOWN STRL REUS W/TWL XL LVL3 (GOWN DISPOSABLE) ×2 IMPLANT
NDL HYPO 25X1 1.5 SAFETY (NEEDLE) ×2 IMPLANT
NEEDLE HYPO 25X1 1.5 SAFETY (NEEDLE) ×2 IMPLANT
NS IRRIG 1000ML POUR BTL (IV SOLUTION) ×2 IMPLANT
PACK BASIN DAY SURGERY FS (CUSTOM PROCEDURE TRAY) ×2 IMPLANT
PADDING CAST ABS COTTON 4X4 ST (CAST SUPPLIES) ×2 IMPLANT
STOCKINETTE 4X48 STRL (DRAPES) ×2 IMPLANT
SUT ETHILON 4 0 PS 2 18 (SUTURE) ×2 IMPLANT
SUT MON AB 5-0 PS2 18 (SUTURE) IMPLANT
SUT VIC AB 4-0 P2 18 (SUTURE) IMPLANT
SYR BULB EAR ULCER 3OZ GRN STR (SYRINGE) ×2 IMPLANT
SYR CONTROL 10ML LL (SYRINGE) ×2 IMPLANT
TOWEL GREEN STERILE FF (TOWEL DISPOSABLE) ×4 IMPLANT
UNDERPAD 30X36 HEAVY ABSORB (UNDERPADS AND DIAPERS) ×2 IMPLANT

## 2022-04-08 NOTE — Discharge Instructions (Addendum)
No tylenol or ibuprofen until 7:00 p.m.   [facility] Instructions Hand Surgery  Wound Care: Keep your hand elevated above the level of your heart.  Do not allow it to dangle by your side.  Keep the dressing dry and do not remove it unless your doctor advises you to do so.  He will usually change it at the time of your post-op visit.  Moving your fingers is advised to stimulate circulation but will depend on the site of your surgery.  If you have a splint applied, your doctor will advise you regarding movement.  Activity: Do not drive or operate machinery today.  Rest today and then you may return to your normal activity and work as indicated by your physician.  Diet:  Drink liquids today or eat a light diet.  You may resume a regular diet tomorrow.    General expectations: Pain for two to three days. Fingers may become slightly swollen.  Call your doctor if any of the following occur: Severe pain not relieved by pain medication. Elevated temperature. Dressing soaked with blood. Inability to move fingers. White or bluish color to fingers.  Post Anesthesia Home Care Instructions  Activity: Get plenty of rest for the remainder of the day. A responsible individual must stay with you for 24 hours following the procedure.  For the next 24 hours, DO NOT: -Drive a car -Paediatric nurse -Drink alcoholic beverages -Take any medication unless instructed by your physician -Make any legal decisions or sign important papers.  Meals: Start with liquid foods such as gelatin or soup. Progress to regular foods as tolerated. Avoid greasy, spicy, heavy foods. If nausea and/or vomiting occur, drink only clear liquids until the nausea and/or vomiting subsides. Call your physician if vomiting continues.  Special Instructions/Symptoms: Your throat may feel dry or sore from the anesthesia or the breathing tube placed in your throat during surgery. If this causes discomfort, gargle with warm salt  water. The discomfort should disappear within 24 hours.  If you had a scopolamine patch placed behind your ear for the management of post- operative nausea and/or vomiting:  1. The medication in the patch is effective for 72 hours, after which it should be removed.  Wrap patch in a tissue and discard in the trash. Wash hands thoroughly with soap and water. 2. You may remove the patch earlier than 72 hours if you experience unpleasant side effects which may include dry mouth, dizziness or visual disturbances. 3. Avoid touching the patch. Wash your hands with soap and water after contact with the patch.

## 2022-04-08 NOTE — Anesthesia Procedure Notes (Signed)
Procedure Name: LMA Insertion Date/Time: 04/08/2022 2:57 PM  Performed by: Ezequiel Kayser, CRNAPre-anesthesia Checklist: Patient identified, Emergency Drugs available, Suction available and Patient being monitored Patient Re-evaluated:Patient Re-evaluated prior to induction Oxygen Delivery Method: Circle System Utilized Preoxygenation: Pre-oxygenation with 100% oxygen Induction Type: IV induction Ventilation: Mask ventilation without difficulty LMA: LMA inserted LMA Size: 4.0 Number of attempts: 1 Airway Equipment and Method: Bite block Placement Confirmation: positive ETCO2 Tube secured with: Tape Dental Injury: Teeth and Oropharynx as per pre-operative assessment

## 2022-04-08 NOTE — H&P (Addendum)
Tyler Mack is an 56 y.o. male.   Chief Complaint: carpal tunnel syndrome HPI: 56 yo male with numbness and tingling bilateral hands.  Nocturnal symptoms.  Positive nerve conduction studies.  He wishes to have bilateral carpal tunnel release.  He has also noted a mass at the volar radial aspect of the right wrist that he would like removed same surgical setting.  Allergies:  Allergies  Allergen Reactions   Penicillins Anaphylaxis, Swelling and Other (See Comments)    Did it involve swelling of the face/tongue/throat, SOB, or low BP? Yes Did it involve sudden or severe rash/hives, skin peeling, or any reaction on the inside of your mouth or nose? No Did you need to seek medical attention at a hospital or doctor's office? Yes When did it last happen?     20 years If all above answers are "NO", may proceed with cephalosporin use.     Past Medical History:  Diagnosis Date   Allergy    GERD (gastroesophageal reflux disease)    Restless legs syndrome     Past Surgical History:  Procedure Laterality Date   KNEE ARTHROSCOPY     KNEE SURGERY      Family History: Family History  Problem Relation Age of Onset   Diabetes Mother    Stomach cancer Father    Heart disease Brother    Hyperlipidemia Brother    Hypertension Brother    Mental illness Brother    Alcohol abuse Brother    Drug abuse Brother    Asthma Son    Alcohol abuse Maternal Aunt    Cancer Maternal Aunt    Alcohol abuse Maternal Uncle    Cancer Maternal Uncle     Social History:   reports that he has been smoking cigarettes. He has been smoking an average of .5 packs per day. He has never used smokeless tobacco. He reports current alcohol use of about 2.0 standard drinks of alcohol per week. He reports that he does not use drugs.  Medications: Medications Prior to Admission  Medication Sig Dispense Refill   buPROPion (WELLBUTRIN XL) 300 MG 24 hr tablet Take 1 tablet by mouth daily. 90 tablet 3   celecoxib  (CELEBREX) 200 MG capsule Take 1 capsule by mouth with food once daily 30 capsule 0   rosuvastatin (CRESTOR) 10 MG tablet Take 1 tablet (10 mg total) by mouth daily. 90 tablet 2   traMADol (ULTRAM) 50 MG tablet Take 1 tablet by mouth every 6 hours as needed. 120 tablet 2   betamethasone dipropionate 0.05 % cream Apply to the affected areas 2 times a day, 2 weeks on and 1 week off, repeat as needed 45 g 1   buPROPion (WELLBUTRIN XL) 300 MG 24 hr tablet Take 1 tablet by mouth once a day 90 tablet 3   cyclobenzaprine (FLEXERIL) 10 MG tablet Take 1 tablet (10 mg total) by mouth 2 (two) times daily as needed for muscle spasms. 20 tablet 0   cyclobenzaprine (FLEXERIL) 10 MG tablet TAKE 1 TO 2 TABLETS BY MOUTH AT BEDTIME AS NEEDED. 60 tablet 5   cyclobenzaprine (FLEXERIL) 10 MG tablet TAKE 1 TO 2 TABLETS BY MOUTH AT BEDTIME AS NEEDED. 60 tablet 3   cyclobenzaprine (FLEXERIL) 10 MG tablet Take 1 tablet by mouth 3 times a day 60 tablet 2   cyclobenzaprine (FLEXERIL) 10 MG tablet Take 1 tablet by mouth 3 times daily. 60 tablet 5   cyclobenzaprine (FLEXERIL) 10 MG tablet Take 1 tablet  three times a day. 60 tablet 5   fluticasone (FLONASE) 50 MCG/ACT nasal spray Place 1 spray into both nostrils daily for 14 days. 16 g 0   podofilox (CONDYLOX) 0.5 % external solution Apply to affected areas twice daily for 3 days on then off for 4 days. 3.5 mL 11   tacrolimus (PROTOPIC) 0.1 % ointment Apply to affected areas 2 times a day for 2 weeks on and 2 weeks off as directed 60 g 2    No results found for this or any previous visit (from the past 48 hour(s)).  No results found.    Height '5\' 11"'$  (1.803 m), weight 85.7 kg.  General appearance: alert, cooperative, and appears stated age Head: Normocephalic, without obvious abnormality, atraumatic Neck: supple, symmetrical, trachea midline Extremities: Intact capillary refill all digits.  +epl/fpl/io.  No wounds.  Pulses: 2+ and symmetric Skin: Skin color, texture,  turgor normal. No rashes or lesions Neurologic: Grossly normal Incision/Wound: none  Assessment/Plan Bilateral carpal tunnel syndrome.  Non operative and operative treatment options have been discussed with the patient and patient wishes to proceed with operative treatment.  We also discussed excision of the volar wrist mass which appears to be a ganglion cyst.  He may have some underlying arthritis and some of the discomfort he has may be related to the arthritis and this would not be changed with excision of the mass.  He voiced understanding of this.  Risks, benefits, and alternatives of surgery have been discussed and the patient agrees with the plan of care.   Leanora Cover 04/08/2022, 12:37 PM

## 2022-04-08 NOTE — Op Note (Addendum)
04/08/2022 [facility]                              OPERATIVE REPORT   PREOPERATIVE DIAGNOSIS: 1.  Bilateral carpal tunnel syndrome 2.  Right wrist volar ganglion cyst  POSTOPERATIVE DIAGNOSIS: 1.  Bilateral carpal tunnel syndrome 2.  Right wrist volar ganglion cyst  PROCEDURE: 1.  Bilateral carpal tunnel release 2.  Right wrist excision volar ganglion cyst via separate incision  SURGEON:  Leanora Cover, MD  ASSISTANT:  none.  ANESTHESIA: General  IV FLUIDS:  Per anesthesia flow sheet.  ESTIMATED BLOOD LOSS:  Minimal.  COMPLICATIONS:  None.  SPECIMENS: Right wrist volar ganglion to pathology.  TOURNIQUET TIME:    Total Tourniquet Time Documented: Upper Arm (Right) - 36 minutes Total: Upper Arm (Right) - 36 minutes  Forearm (Left) - 22 minutes Total: Forearm (Left) - 22 minutes   DISPOSITION:  Stable to PACU.  LOCATION: [facility]  INDICATIONS: 56 year old male with numbness tingling bilateral hands.  Has nocturnal symptoms.  He has positive nerve conduction studies.  He wishes to have a carpal tunnel release for management of his symptoms.  He also has a cyst at the volar aspect of the right wrist.  He wishes to have this removed as well.  Risks, benefits and alternatives of surgery were discussed including the risk of blood loss; infection; damage to nerves, vessels, tendons, ligaments, bone; failure of surgery; need for additional surgery; complications with wound healing; continued pain; recurrence of carpal tunnel syndrome; and damage to motor branch. He voiced understanding of these risks and elected to proceed.   OPERATIVE COURSE:  After being identified preoperatively by myself, the patient and I agreed upon the procedure and site of procedure.  The surgical site was marked.  Surgical consent had been signed.  He was given preoperative antibiotic prophylaxis.  He was transferred to the operating room and placed on the operating room  table in supine position with lateral upper extremity on armboards.  General anesthesia was induced by the anesthesiologist.  Right upper remedy was prepped and draped in normal sterile orthopaedic fashion.  A surgical pause was performed between the surgeons, anesthesia, and operating room staff, and all were in agreement as to the patient, procedure, and site of procedure.  Tourniquet at the proximal aspect of the extremity was inflated to 250 mmHg after exsanguination of the arm with an Esmarch bandage  Incision was made over the transverse carpal ligament and carried into the subcutaneous tissues by spreading technique.  Bipolar electrocautery was used to obtain hemostasis.  The palmar fascia was sharply incised.  The transverse carpal ligament was identified.  The fascia distal to the ligament was opened.  Retractor was placed and the flexor tendons were identified.  The flexor tendon to the ring finger was identified and retracted radially.  The transverse carpal ligament was then incised from distal to proximal under direct visualization.  Scissors were used to split the distal aspect of the volar antebrachial fascia.  A finger was placed into the wound to ensure complete decompression, which was the case.  The nerve was examined.  It was adherent to the radial leaflet.  The motor branch was identified and was intact.  A separate incision was then made over the cyst at the volar radial aspect of the wrist.  This was carried in subcutaneous tissues by spreading technique.  Bipolar electrocautery is used to  obtain hemostasis.  Superficial palmar branch of the radial artery was adherent to the cyst.  This was freed up and protected.  The cyst was carefully freed up from surrounding soft tissue attachments.  There was a stalk coming from the [facility] joint.  The cyst was removed and sent to pathology for examination.  A 4-0 Vicryl suture was used to close the rent in the capsule.  Both wounds were copiously irrigated  with sterile saline.  Inverted erupted Vicryl sutures were placed in the subcutaneous tissues and the wrist incision.  Both incisions were then closed with running subcuticular 5-0 Monocryl suture.  This was then augmented with Dermabond.  The wounds were injected with 0.25% plain Marcaine to aid in postoperative analgesia.  They were dressed with sterile 4x4s, an ABD, and wrapped with Kerlix and an Ace bandage.  Tourniquet was deflated at 86 minutes.  Fingertips were pink with brisk capillary refill after deflation of the tourniquet.  Operative drapes were broken down.  The left upper extremity was then prepped and draped in normal sterile orthopedic fashion.  A surgical pause was performed between the surgeons, anesthesia, and operating room staff, and all were in agreement as to the patient, procedure, and site of procedure.  Tourniquet at the proximal aspect of the forearm was inflated to 250 mmHg after exsanguination of the arm with an Esmarch bandage  Incision was made over the transverse carpal ligament and carried into the subcutaneous tissues by spreading technique.  Bipolar electrocautery was used to obtain hemostasis.  The palmar fascia was sharply incised.  The transverse carpal ligament was identified.  The fascia distal to the ligament was opened.  Retractor was placed and the flexor tendons were identified.  The flexor tendon to the ring finger was identified and retracted radially.  The transverse carpal ligament was then incised from distal to proximal under direct visualization.  Scissors were used to split the distal aspect of the volar antebrachial fascia.  A finger was placed into the wound to ensure complete decompression, which was the case.  The nerve was examined.  It was adherent to the radial leaflet.  The motor branch was identified and was intact.  The wound was copiously irrigated with sterile saline.  It was then closed with a running subcuticular 5-0 Monocryl suture.  This was  augmented with Dermabond.  The wound was dressed with sterile 4 x 4's and ABD and wrapped with Kerlix and Ace bandage.  Tourniquet was deflated at 22 minutes.  Fingertips were pink with brisk capillary refill after deflation of the tourniquet.  The operative drapes were broken down.  The patient was awoken from anesthesia safely.  He was transferred back to stretcher and taken to the PACU in stable condition.  I will see him back in the office in 1 week for postoperative followup.  I will give him a prescription for Norco 5/325 1 tab PO q6 hours prn pain, dispense # 20.    Leanora Cover, MD Electronically signed, 04/08/22

## 2022-04-08 NOTE — Anesthesia Postprocedure Evaluation (Signed)
Anesthesia Post Note  Patient: Tyler Mack  Procedure(s) Performed: BILATERAL CARPAL TUNNEL RELEASE (Bilateral: Wrist) REMOVAL GANGLION OF WRIST, right (Wrist)     Patient location during evaluation: PACU Anesthesia Type: General Level of consciousness: sedated and patient cooperative Pain management: pain level controlled Vital Signs Assessment: post-procedure vital signs reviewed and stable Respiratory status: spontaneous breathing Cardiovascular status: stable Anesthetic complications: no   No notable events documented.  Last Vitals:  Vitals:   04/08/22 1700 04/08/22 1710  BP: (!) 158/96 (!) 147/94  Pulse: 76 90  Resp: 12 18  Temp:  (!) 36.2 C  SpO2: 97% 100%    Last Pain:  Vitals:   04/08/22 1710  TempSrc:   PainSc: 2                  Nolon Nations

## 2022-04-08 NOTE — Transfer of Care (Signed)
Immediate Anesthesia Transfer of Care Note  Patient: Lia Foyer  Procedure(s) Performed: BILATERAL CARPAL TUNNEL RELEASE (Bilateral: Wrist) REMOVAL GANGLION OF WRIST, right (Wrist)  Patient Location: PACU  Anesthesia Type:General  Level of Consciousness: drowsy  Airway & Oxygen Therapy: Patient Spontanous Breathing and Patient connected to face mask oxygen  Post-op Assessment: Report given to RN and Post -op Vital signs reviewed and stable  Post vital signs: Reviewed and stable  Last Vitals:  Vitals Value Taken Time  BP 151/84 04/08/22 1625  Temp    Pulse 90 04/08/22 1626  Resp 23 04/08/22 1626  SpO2 92 % 04/08/22 1626  Vitals shown include unvalidated device data.  Last Pain:  Vitals:   04/08/22 1246  TempSrc: Oral  PainSc: 0-No pain      Patients Stated Pain Goal: 5 (28/36/62 9476)  Complications: No notable events documented.

## 2022-04-08 NOTE — Anesthesia Preprocedure Evaluation (Addendum)
Anesthesia Evaluation  Patient identified by MRN, date of birth, ID band  Reviewed: Allergy & Precautions, NPO status , Patient's Chart, lab work & pertinent test results  Airway Mallampati: II  TM Distance: >3 FB Neck ROM: Full    Dental  (+) Dental Advisory Given, Poor Dentition, Chipped, Missing,    Pulmonary Current SmokerPatient did not abstain from smoking.,    Pulmonary exam normal breath sounds clear to auscultation       Cardiovascular negative cardio ROS Normal cardiovascular exam Rhythm:Regular Rate:Normal     Neuro/Psych negative neurological ROS     GI/Hepatic Neg liver ROS, GERD  ,  Endo/Other  negative endocrine ROS  Renal/GU negative Renal ROS     Musculoskeletal negative musculoskeletal ROS (+)   Abdominal   Peds  Hematology negative hematology ROS (+)   Anesthesia Other Findings   Reproductive/Obstetrics                           Anesthesia Physical Anesthesia Plan  ASA: 2  Anesthesia Plan: General   Post-op Pain Management: Tylenol PO (pre-op)* and Celebrex PO (pre-op)*   Induction: Intravenous  PONV Risk Score and Plan: 1 and Ondansetron, Treatment may vary due to age or medical condition, Midazolam and Dexamethasone  Airway Management Planned: LMA  Additional Equipment:   Intra-op Plan:   Post-operative Plan: Extubation in OR  Informed Consent: I have reviewed the patients History and Physical, chart, labs and discussed the procedure including the risks, benefits and alternatives for the proposed anesthesia with the patient or authorized representative who has indicated his/her understanding and acceptance.     Dental advisory given  Plan Discussed with: CRNA  Anesthesia Plan Comments:       Anesthesia Quick Evaluation

## 2022-04-09 ENCOUNTER — Encounter (HOSPITAL_BASED_OUTPATIENT_CLINIC_OR_DEPARTMENT_OTHER): Payer: Self-pay | Admitting: Orthopedic Surgery

## 2022-04-09 DIAGNOSIS — G5603 Carpal tunnel syndrome, bilateral upper limbs: Secondary | ICD-10-CM | POA: Diagnosis not present

## 2022-04-09 DIAGNOSIS — M67431 Ganglion, right wrist: Secondary | ICD-10-CM | POA: Diagnosis not present

## 2022-04-10 LAB — SURGICAL PATHOLOGY

## 2022-05-05 ENCOUNTER — Other Ambulatory Visit (HOSPITAL_COMMUNITY): Payer: Self-pay

## 2022-05-24 DIAGNOSIS — H5213 Myopia, bilateral: Secondary | ICD-10-CM | POA: Diagnosis not present

## 2022-06-02 ENCOUNTER — Ambulatory Visit
Admission: EM | Admit: 2022-06-02 | Discharge: 2022-06-02 | Disposition: A | Discharge status: Home / Self Care | Payer: 59 | Attending: Nurse Practitioner | Admitting: Nurse Practitioner

## 2022-06-02 DIAGNOSIS — B349 Viral infection, unspecified: Secondary | ICD-10-CM | POA: Diagnosis not present

## 2022-06-02 DIAGNOSIS — Z79899 Other long term (current) drug therapy: Secondary | ICD-10-CM | POA: Insufficient documentation

## 2022-06-02 DIAGNOSIS — U071 COVID-19: Secondary | ICD-10-CM | POA: Diagnosis not present

## 2022-06-02 DIAGNOSIS — Z8709 Personal history of other diseases of the respiratory system: Secondary | ICD-10-CM | POA: Diagnosis not present

## 2022-06-02 MED ORDER — CETIRIZINE-PSEUDOEPHEDRINE ER 5-120 MG PO TB12: 1.0000 | ORAL_TABLET | Freq: Every day | ORAL | 0 refills | Status: DC

## 2022-06-02 MED ORDER — BENZONATATE 100 MG PO CAPS: 100.0000 mg | ORAL_CAPSULE | Freq: Three times a day (TID) | ORAL | 0 refills | Status: DC | PRN

## 2022-06-02 MED ORDER — FLUTICASONE PROPIONATE 50 MCG/ACT NA SUSP: 2.0000 | Freq: Every day | NASAL | 0 refills | Status: DC

## 2022-06-02 NOTE — ED Triage Notes (Signed)
Chills, body ache, headache, ear fullness, runny nose. Taking TheraFlu to help.

## 2022-06-02 NOTE — Discharge Instructions (Addendum)
COVID/flu test is pending.  You will be contacted if the pending test results are positive.  As discussed, you decline antiviral therapy if the COVID test is positive. Take medication as directed. Increase fluids and get plenty of rest. May take over-the-counter ibuprofen or Tylenol as needed for pain, fever, or general discomfort. Recommend normal saline nasal spray to help with nasal congestion throughout the day. For your cough, it may be helpful to use a humidifier at bedtime during sleep. Follow-up with your primary care physician if symptoms suddenly worsen or if they do not improve within the next 7 to 10 days. Follow-up as needed.

## 2022-06-02 NOTE — ED Provider Notes (Signed)
[facility]    CSN: [id number] Arrival date & time: 06/02/22  0806      History   Chief Complaint Chief Complaint  Patient presents with   Headache   Chills   Generalized Body Aches    HPI Tyler Mack is a 56 y.o. male.   The history is provided by the patient.   Patient presents for complaints of chills, body aches, headache, ear fullness, and runny nose that started over the last several days.  Patient denies fever, chills, sore throat, cough, or GI symptoms.  Patient reports that he has a history of allergy symptoms after he takes a trip to the mountains.  He reports he has been taking TheraFlu with minimal relief.  He also reports that he took a home COVID test this morning, which was negative.  Past Medical History:  Diagnosis Date   Allergy    GERD (gastroesophageal reflux disease)    Restless legs syndrome     There are no problems to display for this patient.   Past Surgical History:  Procedure Laterality Date   CARPAL TUNNEL RELEASE Bilateral 04/08/2022   Procedure: BILATERAL CARPAL TUNNEL RELEASE;  Surgeon: Leanora Cover, MD;  Location: [place];  Service: Orthopedics;  Laterality: Bilateral;  60 MIN   GANGLION CYST EXCISION  04/08/2022   Procedure: REMOVAL GANGLION OF WRIST, right;  Surgeon: Leanora Cover, MD;  Location: [place];  Service: Orthopedics;;   KNEE ARTHROSCOPY     KNEE SURGERY         Home Medications    Prior to Admission medications   Medication Sig Start Date End Date Taking? Authorizing Provider  benzonatate (TESSALON PERLES) 100 MG capsule Take 1 capsule (100 mg total) by mouth 3 (three) times daily as needed for cough. 06/02/22  Yes Martie Fulgham-Warren, Alda Lea, NP  buPROPion (WELLBUTRIN XL) 300 MG 24 hr tablet Take 1 tablet by mouth once a day 04/29/21  Yes   buPROPion (WELLBUTRIN XL) 300 MG 24 hr tablet Take 1 tablet by mouth daily. 01/29/22  Yes   cetirizine-pseudoephedrine (ZYRTEC-D)  5-120 MG tablet Take 1 tablet by mouth daily. 06/02/22  Yes Mishael Haran-Warren, Alda Lea, NP  cyclobenzaprine (FLEXERIL) 10 MG tablet Take 1 tablet (10 mg total) by mouth 2 (two) times daily as needed for muscle spasms. 06/13/19  Yes Avegno, Darrelyn Hillock, FNP  fluticasone (FLONASE) 50 MCG/ACT nasal spray Place 2 sprays into both nostrils daily. 06/02/22  Yes Jabreel Chimento-Warren, Alda Lea, NP  rosuvastatin (CRESTOR) 10 MG tablet Take 1 tablet (10 mg total) by mouth daily. 02/06/22  Yes   traMADol (ULTRAM) 50 MG tablet Take 1 tablet by mouth every 6 hours as needed. 01/29/22  Yes   betamethasone dipropionate 0.05 % cream Apply to the affected areas 2 times a day, 2 weeks on and 1 week off, repeat as needed 02/04/21     celecoxib (CELEBREX) 200 MG capsule Take 1 capsule by mouth with food once daily 02/19/22     cyclobenzaprine (FLEXERIL) 10 MG tablet TAKE 1 TO 2 TABLETS BY MOUTH AT BEDTIME AS NEEDED. 10/03/20     cyclobenzaprine (FLEXERIL) 10 MG tablet TAKE 1 TO 2 TABLETS BY MOUTH AT BEDTIME AS NEEDED. 01/14/21     cyclobenzaprine (FLEXERIL) 10 MG tablet Take 1 tablet by mouth 3 times a day 04/29/21     cyclobenzaprine (FLEXERIL) 10 MG tablet Take 1 tablet by mouth 3 times daily. 08/06/21     cyclobenzaprine (FLEXERIL) 10  MG tablet Take 1 tablet three times a day. 01/29/22     HYDROcodone-acetaminophen (NORCO) 5-325 MG tablet Take 1 tablet by mouth every 6 (six) hours as needed for pain. 04/08/22   Leanora Cover, MD  podofilox (CONDYLOX) 0.5 % external solution Apply to affected areas twice daily for 3 days on then off for 4 days. 08/06/21     tacrolimus (PROTOPIC) 0.1 % ointment Apply to affected areas 2 times a day for 2 weeks on and 2 weeks off as directed 04/15/21     cetirizine (ZYRTEC) 10 MG tablet Take 1 tablet (10 mg total) daily by mouth. 04/28/17 06/13/19  Jaynee Eagles, PA-C    Family History Family History  Problem Relation Age of Onset   Diabetes Mother    Stomach cancer Father    Heart disease Brother     Hyperlipidemia Brother    Hypertension Brother    Mental illness Brother    Alcohol abuse Brother    Drug abuse Brother    Asthma Son    Alcohol abuse Maternal Aunt    Cancer Maternal Aunt    Alcohol abuse Maternal Uncle    Cancer Maternal Uncle     Social History Social History   Tobacco Use   Smoking status: Every Day    Packs/day: 0.50    Types: Cigarettes   Smokeless tobacco: Never  Vaping Use   Vaping Use: Never used  Substance Use Topics   Alcohol use: Yes    Alcohol/week: 2.0 standard drinks of alcohol    Types: 2 Shots of liquor per week    Comment: every other weekend   Drug use: No     Allergies   Penicillins   Review of Systems Review of Systems Per HPI  Physical Exam Triage Vital Signs ED Triage Vitals  Enc Vitals Group     BP 06/02/22 0827 120/81     Pulse Rate 06/02/22 0827 93     Resp 06/02/22 0827 17     Temp 06/02/22 0827 98.4 F (36.9 C)     Temp Source 06/02/22 0827 Oral     SpO2 06/02/22 0827 98 %     Weight --      Height --      Head Circumference --      Peak Flow --      Pain Score 06/02/22 0831 5     Pain Loc --      Pain Edu? --      Excl. in [place]? --    No data found.  Updated Vital Signs BP 120/81 (BP Location: Right Arm)   Pulse 93   Temp 98.4 F (36.9 C) (Oral)   Resp 17   SpO2 98%   Visual Acuity Right Eye Distance:   Left Eye Distance:   Bilateral Distance:    Right Eye Near:   Left Eye Near:    Bilateral Near:     Physical Exam Vitals and nursing note reviewed.  Constitutional:      General: He is not in acute distress.    Appearance: He is well-developed.  HENT:     Head: Normocephalic.     Right Ear: Tympanic membrane, ear canal and external ear normal.     Left Ear: Tympanic membrane, ear canal and external ear normal.     Nose: Congestion present.     Right Turbinates: Enlarged and swollen.     Left Turbinates: Enlarged and swollen.     Right Sinus: Maxillary  sinus tenderness and frontal sinus  tenderness present.     Left Sinus: Maxillary sinus tenderness and frontal sinus tenderness present.     Mouth/Throat:     Mouth: Mucous membranes are moist.     Pharynx: No posterior oropharyngeal erythema.  Eyes:     Extraocular Movements: Extraocular movements intact.     Pupils: Pupils are equal, round, and reactive to light.  Cardiovascular:     Rate and Rhythm: Regular rhythm.     Heart sounds: Normal heart sounds.  Pulmonary:     Effort: Pulmonary effort is normal. No respiratory distress.     Breath sounds: Normal breath sounds. No stridor. No wheezing, rhonchi or rales.  Abdominal:     General: Bowel sounds are normal.     Palpations: Abdomen is soft.  Musculoskeletal:     Cervical back: Normal range of motion.  Lymphadenopathy:     Cervical: No cervical adenopathy.  Skin:    General: Skin is warm and dry.  Neurological:     General: No focal deficit present.     Mental Status: He is alert and oriented to person, place, and time.     GCS: GCS eye subscore is 4. GCS verbal subscore is 5. GCS motor subscore is 6.  Psychiatric:        Mood and Affect: Mood normal.        Speech: Speech normal.        Behavior: Behavior normal.      UC Treatments / Results  Labs (all labs ordered are listed, but only abnormal results are displayed) Labs Reviewed  RESP PANEL BY RT-PCR (FLU A&B, COVID) [id number]    EKG   Radiology No results found.  Procedures Procedures (including critical care time)  Medications Ordered in UC Medications - No data to display  Initial Impression / Assessment and Plan / UC Course  I have reviewed the triage vital signs and the nursing notes.  Pertinent labs & imaging results that were available during my care of the patient were reviewed by me and considered in my medical decision making (see chart for details).  The patient is well-appearing, he is in no acute distress, vital signs are stable.  COVID/flu test is pending.  Suspect viral  illness given the patient's duration of symptoms and current presentation.  Symptomatic treatment was provided with Zyrtec-D to help with his nasal congestion, his cough, Tessalon Perles 100 mg was also prescribed, along with fluticasone 50 mcg nasal spray for nasal congestion.  Patient was advised that he will be contacted if his pending test results are positive.  Patient declined the use of antiviral therapy if the COVID test is positive.  Supportive care recommendations were provided to the patient.  Patient verbalizes understanding.  All questions were answered.  Patient is stable for discharge.  Note was provided for work.  Final Clinical Impressions(s) / UC Diagnoses   Final diagnoses:  Viral illness  History of allergic rhinitis     Discharge Instructions      COVID/flu test is pending.  You will be contacted if the pending test results are positive.  As discussed, you decline antiviral therapy if the COVID test is positive. Take medication as directed. Increase fluids and get plenty of rest. May take over-the-counter ibuprofen or Tylenol as needed for pain, fever, or general discomfort. Recommend normal saline nasal spray to help with nasal congestion throughout the day. For your cough, it may be helpful to use a humidifier at  bedtime during sleep. Follow-up with your primary care physician if symptoms suddenly worsen or if they do not improve within the next 7 to 10 days. Follow-up as needed.     ED Prescriptions     Medication Sig Dispense Auth. Provider   fluticasone (FLONASE) 50 MCG/ACT nasal spray Place 2 sprays into both nostrils daily. 16 g Arly Salminen-Warren, Alda Lea, NP   cetirizine-pseudoephedrine (ZYRTEC-D) 5-120 MG tablet Take 1 tablet by mouth daily. 30 tablet Delaynee Alred-Warren, Alda Lea, NP   benzonatate (TESSALON PERLES) 100 MG capsule Take 1 capsule (100 mg total) by mouth 3 (three) times daily as needed for cough. 30 capsule Collins Kerby-Warren, Alda Lea, NP      PDMP  not reviewed this encounter.   Tish Men, NP 06/02/22 1049

## 2022-06-03 ENCOUNTER — Other Ambulatory Visit (HOSPITAL_COMMUNITY): Payer: Self-pay

## 2022-06-03 ENCOUNTER — Other Ambulatory Visit: Payer: Self-pay

## 2022-06-03 LAB — RESP PANEL BY RT-PCR (FLU A&B, COVID) ARPGX2
Influenza A by PCR: NEGATIVE
Influenza B by PCR: NEGATIVE
SARS Coronavirus 2 by RT PCR: POSITIVE — AB

## 2022-06-04 ENCOUNTER — Other Ambulatory Visit (HOSPITAL_COMMUNITY): Payer: Self-pay

## 2022-06-04 ENCOUNTER — Other Ambulatory Visit: Payer: Self-pay

## 2022-06-05 ENCOUNTER — Other Ambulatory Visit (HOSPITAL_COMMUNITY): Payer: Self-pay

## 2022-06-05 DIAGNOSIS — G894 Chronic pain syndrome: Secondary | ICD-10-CM | POA: Diagnosis not present

## 2022-06-05 MED ORDER — TRAMADOL HCL 50 MG PO TABS
50.0000 mg | ORAL_TABLET | Freq: Four times a day (QID) | ORAL | 2 refills | Status: DC | PRN
  Filled 2022-06-05: qty 120, 30d supply, fill #0
  Filled 2022-07-06: qty 120, 30d supply, fill #1
  Filled 2022-08-05: qty 120, 30d supply, fill #2

## 2022-07-04 ENCOUNTER — Other Ambulatory Visit: Payer: Self-pay

## 2022-07-07 ENCOUNTER — Other Ambulatory Visit (HOSPITAL_COMMUNITY): Payer: Self-pay

## 2022-07-23 ENCOUNTER — Other Ambulatory Visit (HOSPITAL_COMMUNITY): Payer: Self-pay

## 2022-07-23 ENCOUNTER — Other Ambulatory Visit: Payer: Self-pay

## 2022-07-23 DIAGNOSIS — L920 Granuloma annulare: Secondary | ICD-10-CM | POA: Diagnosis not present

## 2022-07-23 MED ORDER — BETAMETHASONE DIPROPIONATE 0.05 % EX CREA
1.0000 | TOPICAL_CREAM | Freq: Two times a day (BID) | CUTANEOUS | 2 refills | Status: AC
  Filled 2022-07-23: qty 45, 28d supply, fill #0

## 2022-07-23 MED ORDER — TACROLIMUS 0.1 % EX OINT
1.0000 | TOPICAL_OINTMENT | Freq: Two times a day (BID) | CUTANEOUS | 2 refills | Status: AC
  Filled 2022-07-23: qty 100, 50d supply, fill #0

## 2022-07-24 ENCOUNTER — Other Ambulatory Visit: Payer: Self-pay

## 2022-07-29 ENCOUNTER — Other Ambulatory Visit: Payer: Self-pay

## 2022-08-05 ENCOUNTER — Other Ambulatory Visit: Payer: Self-pay

## 2022-08-05 ENCOUNTER — Other Ambulatory Visit (HOSPITAL_COMMUNITY): Payer: Self-pay

## 2022-09-01 ENCOUNTER — Other Ambulatory Visit (HOSPITAL_COMMUNITY): Payer: Self-pay

## 2022-09-01 ENCOUNTER — Other Ambulatory Visit: Payer: Self-pay

## 2022-09-01 MED ORDER — TRAMADOL HCL 50 MG PO TABS
50.0000 mg | ORAL_TABLET | Freq: Four times a day (QID) | ORAL | 0 refills | Status: DC | PRN
  Filled 2022-09-01: qty 120, 30d supply, fill #0

## 2022-10-03 ENCOUNTER — Other Ambulatory Visit: Payer: Self-pay

## 2022-10-03 ENCOUNTER — Other Ambulatory Visit (HOSPITAL_COMMUNITY): Payer: Self-pay

## 2022-10-06 ENCOUNTER — Other Ambulatory Visit: Payer: Self-pay

## 2022-10-08 ENCOUNTER — Other Ambulatory Visit (HOSPITAL_COMMUNITY): Payer: Self-pay

## 2022-10-10 ENCOUNTER — Other Ambulatory Visit (HOSPITAL_COMMUNITY): Payer: Self-pay

## 2022-10-10 DIAGNOSIS — M1991 Primary osteoarthritis, unspecified site: Secondary | ICD-10-CM | POA: Diagnosis not present

## 2022-10-10 DIAGNOSIS — M179 Osteoarthritis of knee, unspecified: Secondary | ICD-10-CM | POA: Diagnosis not present

## 2022-10-10 DIAGNOSIS — G894 Chronic pain syndrome: Secondary | ICD-10-CM | POA: Diagnosis not present

## 2022-10-10 MED ORDER — TRAMADOL HCL 50 MG PO TABS
50.0000 mg | ORAL_TABLET | Freq: Four times a day (QID) | ORAL | 4 refills | Status: DC | PRN
  Filled 2022-10-10: qty 120, 30d supply, fill #0
  Filled 2022-11-10: qty 120, 30d supply, fill #1
  Filled 2022-11-29 – 2022-12-16 (×3): qty 120, 30d supply, fill #2
  Filled 2023-01-13: qty 120, 30d supply, fill #3
  Filled 2023-02-09 – 2023-02-17 (×3): qty 120, 30d supply, fill #4

## 2022-10-10 MED ORDER — CYCLOBENZAPRINE HCL 10 MG PO TABS
10.0000 mg | ORAL_TABLET | Freq: Three times a day (TID) | ORAL | 11 refills | Status: DC
  Filled 2022-10-10 – 2022-11-29 (×2): qty 60, 20d supply, fill #0
  Filled 2023-01-13: qty 60, 20d supply, fill #1

## 2022-10-11 ENCOUNTER — Other Ambulatory Visit (HOSPITAL_COMMUNITY): Payer: Self-pay

## 2022-10-13 ENCOUNTER — Other Ambulatory Visit: Payer: Self-pay

## 2022-11-11 ENCOUNTER — Other Ambulatory Visit: Payer: Self-pay

## 2022-11-29 ENCOUNTER — Telehealth: Payer: Commercial Managed Care - PPO | Admitting: Physician Assistant

## 2022-11-29 DIAGNOSIS — W57XXXA Bitten or stung by nonvenomous insect and other nonvenomous arthropods, initial encounter: Secondary | ICD-10-CM

## 2022-11-29 DIAGNOSIS — R21 Rash and other nonspecific skin eruption: Secondary | ICD-10-CM

## 2022-11-29 MED ORDER — DOXYCYCLINE HYCLATE 100 MG PO TABS: 100.0000 mg | ORAL_TABLET | Freq: Two times a day (BID) | ORAL | 0 refills | Status: AC

## 2022-11-29 NOTE — Progress Notes (Signed)
E-Visit for Tick Bite  Thank you for describing your tick bite, Here is how we plan to help! Based on the information that you shared with me it looks like you have A tick that bite that we will treat with a short course of doxycycline.  In most cases a tick bite is painless and does not itch.  Most tick bites in which the tick is quickly removed do not require prescriptions. Ticks can transmit several diseases if they are infected and remain attacked to your skin. Therefore the length that the tick was attached and any symptoms you have experienced after the bite are import to accurately develop your custom treatment plan. In most cases a single dose of doxycycline may prevent the development of a more serious condition.  Based on your information I have  Provided a home care guide for tick bites and  instructions on when to call for help. and Your symptoms indicate that you need a longer course of antibiotics and a follow up visit with a provider. I have sent doxycycline 100 mg twice a day for 10 days to the pharmacy that you selected. You will need to schedule a follow up visit with your provider. If you do not have a primary care provider you may use our telehealth physicians on the web at [facility]  Which ticks  are associated with illness?  The Wood Tick (dog tick) is the size of a watermelon seed and can sometimes transmit Rocky Mountain spotted fever and [place] tick fever.   The Deer Tick (black-legged tick) is between the size of a poppy seed (pin head) and an apple seed, and can sometimes transmit Lyme disease.  A brown to black tick with a white splotch on its back is likely a male Amblyomma americanum (Lone Star tick). This tick has been associated with Southern Tick Associated illness ( STARI)  Lyme disease has become the most common tick-borne illness in the [place]. The risk of Lyme disease following a recognized deer tick bite is estimated to be 1%.  The  majority of cases of Lyme disease start with a bull's eye rash at the site of the tick bite. The rash can occur days to weeks (typically 7-10 days) after a tick bite. Treatment with antibiotics is indicated if this rash appears. Flu-like symptoms may accompany the rash, including: fever, chills, headaches, muscle aches, and fatigue. Removing ticks promptly may prevent tick borne disease.  What can be used to prevent Tick Bites?  Insect repellant with at leas 20% DEET. Wearing long pants with sock and shoes. Avoiding tall grass and heavily wooded areas. Checking your skin after being outdoors. Shower with a washcloth after outdoor exposures.  HOME CARE ADVICE FOR TICK BITE  Wood Tick Removal:  Use a pair of tweezers and grasp the wood tick close to the skin (on its head). Pull the wood tick straight upward without twisting or crushing it. Maintain a steady pressure until it releases its grip.   If tweezers aren't available, use fingers, a loop of thread around the jaws, or a needle between the jaws for traction.  Note: covering the tick with petroleum jelly, nail polish or rubbing alcohol doesn't work. Neither does touching the tick with a hot or cold object. Tiny Deer Tick Removal:   Needs to be scraped off with a knife blade or credit card edge. Place tick in a sealed container (e.g. glass jar, zip lock plastic bag), in case your doctor wants to see   it. Tick's Head Removal:  If the wood tick's head breaks off in the skin, it must be removed. Clean the skin. Then use a sterile needle to uncover the head and lift it out or scrape it off.  If a very small piece of the head remains, the skin will eventually slough it off. Antibiotic Ointment:  Wash the wound and your hands with soap and water after removal to prevent catching any tick disease.  Apply an over the counter antibiotic ointment (e.g. bacitracin) to the bite once. Expected Course: Tick bites normally don't itch or hurt. That's why  they often go unnoticed. Call Your Doctor If:  You can't remove the tick or the tick's head Fever, a severe head ache, or rash occur in the next 2 weeks Bite begins to look infected Lyme's disease is common in your area You have not had a tetanus in the last 10 years Your current symptoms become worse    MAKE SURE YOU  Understand these instructions. Will watch your condition. Will get help right away if you are not doing well or get worse.    Thank you for choosing an e-visit.  Your e-visit answers were reviewed by a board certified advanced clinical practitioner to complete your personal care plan. Depending upon the condition, your plan could have included both over the counter or prescription medications.  Please review your pharmacy choice. Make sure the pharmacy is open so you can pick up prescription now. If there is a problem, you may contact your provider through MyChart messaging and have the prescription routed to another pharmacy.  Your safety is important to us. If you have drug allergies check your prescription carefully.   For the next 24 hours you can use MyChart to ask questions about today's visit, request a non-urgent call back, or ask for a work or school excuse. You will get an email in the next two days asking about your experience. I hope that your e-visit has been valuable and will speed your recovery.   I have spent 5 minutes in review of e-visit questionnaire, review and updating patient chart, medical decision making and response to patient.   Fred Hammes Z Ward, PA-C    

## 2022-12-01 ENCOUNTER — Other Ambulatory Visit: Payer: Self-pay

## 2022-12-01 ENCOUNTER — Other Ambulatory Visit (HOSPITAL_COMMUNITY): Payer: Self-pay

## 2022-12-03 DIAGNOSIS — L920 Granuloma annulare: Secondary | ICD-10-CM | POA: Diagnosis not present

## 2022-12-05 ENCOUNTER — Other Ambulatory Visit: Payer: Self-pay

## 2022-12-16 ENCOUNTER — Other Ambulatory Visit (HOSPITAL_COMMUNITY): Payer: Self-pay

## 2023-01-13 ENCOUNTER — Other Ambulatory Visit (HOSPITAL_COMMUNITY): Payer: Self-pay

## 2023-02-09 ENCOUNTER — Other Ambulatory Visit (HOSPITAL_COMMUNITY): Payer: Self-pay

## 2023-02-10 ENCOUNTER — Other Ambulatory Visit: Payer: Self-pay

## 2023-02-12 ENCOUNTER — Other Ambulatory Visit (HOSPITAL_COMMUNITY): Payer: Self-pay

## 2023-02-17 ENCOUNTER — Other Ambulatory Visit (HOSPITAL_COMMUNITY): Payer: Self-pay

## 2023-03-12 ENCOUNTER — Other Ambulatory Visit (HOSPITAL_COMMUNITY): Payer: Self-pay

## 2023-03-12 DIAGNOSIS — G894 Chronic pain syndrome: Secondary | ICD-10-CM | POA: Diagnosis not present

## 2023-03-12 DIAGNOSIS — G4701 Insomnia due to medical condition: Secondary | ICD-10-CM | POA: Diagnosis not present

## 2023-03-12 DIAGNOSIS — Z23 Encounter for immunization: Secondary | ICD-10-CM | POA: Diagnosis not present

## 2023-03-12 DIAGNOSIS — E663 Overweight: Secondary | ICD-10-CM | POA: Diagnosis not present

## 2023-03-12 DIAGNOSIS — Z6825 Body mass index (BMI) 25.0-25.9, adult: Secondary | ICD-10-CM | POA: Diagnosis not present

## 2023-03-12 MED ORDER — BUPROPION HCL ER (XL) 300 MG PO TB24
300.0000 mg | ORAL_TABLET | Freq: Every day | ORAL | 2 refills | Status: DC
  Filled 2023-03-12: qty 90, 90d supply, fill #0

## 2023-03-12 MED ORDER — BUPROPION HCL ER (XL) 300 MG PO TB24
300.0000 mg | ORAL_TABLET | Freq: Every day | ORAL | 2 refills | Status: AC
  Filled 2023-05-15: qty 90, 90d supply, fill #0
  Filled 2023-09-13: qty 90, 90d supply, fill #1
  Filled 2023-12-30: qty 90, 90d supply, fill #2

## 2023-03-12 MED ORDER — TRAMADOL HCL 50 MG PO TABS
50.0000 mg | ORAL_TABLET | Freq: Four times a day (QID) | ORAL | 2 refills | Status: DC | PRN
  Filled 2023-03-13 – 2023-03-17 (×2): qty 120, 30d supply, fill #0
  Filled 2023-04-17 (×2): qty 120, 30d supply, fill #1
  Filled 2023-05-15: qty 120, 30d supply, fill #2

## 2023-03-13 ENCOUNTER — Other Ambulatory Visit (HOSPITAL_COMMUNITY): Payer: Self-pay

## 2023-03-17 ENCOUNTER — Other Ambulatory Visit (HOSPITAL_COMMUNITY): Payer: Self-pay

## 2023-03-19 DIAGNOSIS — Z1212 Encounter for screening for malignant neoplasm of rectum: Secondary | ICD-10-CM | POA: Diagnosis not present

## 2023-03-19 DIAGNOSIS — Z1211 Encounter for screening for malignant neoplasm of colon: Secondary | ICD-10-CM | POA: Diagnosis not present

## 2023-04-05 LAB — COLOGUARD: COLOGUARD: NEGATIVE

## 2023-04-17 ENCOUNTER — Other Ambulatory Visit (HOSPITAL_COMMUNITY): Payer: Self-pay

## 2023-05-15 ENCOUNTER — Other Ambulatory Visit: Payer: Self-pay

## 2023-05-15 ENCOUNTER — Other Ambulatory Visit (HOSPITAL_COMMUNITY): Payer: Self-pay

## 2023-05-18 ENCOUNTER — Other Ambulatory Visit (HOSPITAL_COMMUNITY): Payer: Self-pay

## 2023-05-18 MED ORDER — CYCLOBENZAPRINE HCL 10 MG PO TABS
10.0000 mg | ORAL_TABLET | Freq: Three times a day (TID) | ORAL | 5 refills | Status: AC
  Filled 2023-05-18: qty 60, 20d supply, fill #0
  Filled 2023-09-13: qty 60, 20d supply, fill #1
  Filled 2023-12-30: qty 60, 20d supply, fill #2
  Filled 2024-02-01: qty 60, 20d supply, fill #3
  Filled 2024-03-30: qty 60, 20d supply, fill #4
  Filled 2024-04-28: qty 60, 20d supply, fill #5

## 2023-05-20 ENCOUNTER — Other Ambulatory Visit: Payer: Self-pay

## 2023-05-21 ENCOUNTER — Other Ambulatory Visit (HOSPITAL_COMMUNITY): Payer: Self-pay

## 2023-06-18 ENCOUNTER — Other Ambulatory Visit (HOSPITAL_COMMUNITY): Payer: Self-pay

## 2023-06-18 ENCOUNTER — Other Ambulatory Visit: Payer: Self-pay

## 2023-06-18 MED ORDER — TRAMADOL HCL 50 MG PO TABS
50.0000 mg | ORAL_TABLET | Freq: Four times a day (QID) | ORAL | 1 refills | Status: DC | PRN
  Filled 2023-06-18: qty 120, 30d supply, fill #0
  Filled 2023-07-20: qty 120, 30d supply, fill #1

## 2023-06-22 ENCOUNTER — Other Ambulatory Visit (HOSPITAL_COMMUNITY): Payer: Self-pay

## 2023-07-20 ENCOUNTER — Other Ambulatory Visit: Payer: Self-pay

## 2023-07-21 ENCOUNTER — Other Ambulatory Visit (HOSPITAL_COMMUNITY): Payer: Self-pay

## 2023-08-18 ENCOUNTER — Other Ambulatory Visit (HOSPITAL_COMMUNITY): Payer: Self-pay

## 2023-08-18 DIAGNOSIS — G894 Chronic pain syndrome: Secondary | ICD-10-CM | POA: Diagnosis not present

## 2023-08-18 MED ORDER — CYCLOBENZAPRINE HCL 10 MG PO TABS
10.0000 mg | ORAL_TABLET | Freq: Three times a day (TID) | ORAL | 5 refills | Status: AC
  Filled 2023-08-18: qty 60, 20d supply, fill #0

## 2023-08-18 MED ORDER — TRAMADOL HCL 50 MG PO TABS
50.0000 mg | ORAL_TABLET | Freq: Four times a day (QID) | ORAL | 1 refills | Status: DC | PRN
  Filled 2023-08-18: qty 120, 30d supply, fill #0
  Filled 2023-09-13 – 2023-09-15 (×2): qty 120, 30d supply, fill #1

## 2023-09-14 ENCOUNTER — Other Ambulatory Visit: Payer: Self-pay

## 2023-09-14 ENCOUNTER — Other Ambulatory Visit (HOSPITAL_COMMUNITY): Payer: Self-pay

## 2023-10-14 ENCOUNTER — Other Ambulatory Visit (HOSPITAL_COMMUNITY): Payer: Self-pay

## 2023-10-14 DIAGNOSIS — G894 Chronic pain syndrome: Secondary | ICD-10-CM | POA: Diagnosis not present

## 2023-10-14 MED ORDER — TRAMADOL HCL 50 MG PO TABS
50.0000 mg | ORAL_TABLET | Freq: Four times a day (QID) | ORAL | 0 refills | Status: DC | PRN
  Filled 2023-10-14: qty 120, 30d supply, fill #0

## 2023-11-12 ENCOUNTER — Other Ambulatory Visit (HOSPITAL_COMMUNITY): Payer: Self-pay

## 2023-11-12 DIAGNOSIS — Z0001 Encounter for general adult medical examination with abnormal findings: Secondary | ICD-10-CM | POA: Diagnosis not present

## 2023-11-12 DIAGNOSIS — E785 Hyperlipidemia, unspecified: Secondary | ICD-10-CM | POA: Diagnosis not present

## 2023-11-12 MED ORDER — BUPROPION HCL ER (XL) 300 MG PO TB24
300.0000 mg | ORAL_TABLET | Freq: Every day | ORAL | 2 refills | Status: AC
  Filled 2023-11-12 – 2024-04-28 (×3): qty 90, 90d supply, fill #0

## 2023-11-12 MED ORDER — TRAMADOL HCL 50 MG PO TABS
50.0000 mg | ORAL_TABLET | Freq: Four times a day (QID) | ORAL | 2 refills | Status: DC | PRN
  Filled 2023-11-12: qty 120, 30d supply, fill #0
  Filled 2023-12-30: qty 120, 30d supply, fill #1
  Filled 2024-02-01: qty 120, 30d supply, fill #2

## 2023-11-18 DIAGNOSIS — L920 Granuloma annulare: Secondary | ICD-10-CM | POA: Diagnosis not present

## 2023-12-15 ENCOUNTER — Encounter: Payer: Self-pay | Admitting: Emergency Medicine

## 2023-12-15 ENCOUNTER — Ambulatory Visit
Admission: EM | Admit: 2023-12-15 | Discharge: 2023-12-15 | Disposition: A | Discharge status: Home / Self Care | Attending: Nurse Practitioner | Admitting: Nurse Practitioner

## 2023-12-15 DIAGNOSIS — M545 Low back pain, unspecified: Secondary | ICD-10-CM | POA: Diagnosis not present

## 2023-12-15 LAB — POCT URINALYSIS DIP (MANUAL ENTRY)
Bilirubin, UA: NEGATIVE
Glucose, UA: NEGATIVE mg/dL
Ketones, POC UA: NEGATIVE mg/dL
Leukocytes, UA: NEGATIVE
Nitrite, UA: NEGATIVE
Protein Ur, POC: NEGATIVE mg/dL
Spec Grav, UA: 1.025 (ref 1.010–1.025)
Urobilinogen, UA: 0.2 U/dL
pH, UA: 6 (ref 5.0–8.0)

## 2023-12-15 MED ORDER — LIDOCAINE 5 % EX PTCH: 1.0000 | MEDICATED_PATCH | CUTANEOUS | 0 refills | Status: AC

## 2023-12-15 MED ORDER — TIZANIDINE HCL 4 MG PO TABS: 4.0000 mg | ORAL_TABLET | Freq: Three times a day (TID) | ORAL | 0 refills | Status: AC | PRN

## 2023-12-15 MED ORDER — KETOROLAC TROMETHAMINE 30 MG/ML IJ SOLN
30.0000 mg | Freq: Once | INTRAMUSCULAR | Status: AC
  Administered 2023-12-15: 30 mg via INTRAMUSCULAR

## 2023-12-15 MED ORDER — TAMSULOSIN HCL 0.4 MG PO CAPS: 0.4000 mg | ORAL_CAPSULE | Freq: Every day | ORAL | 0 refills | Status: AC

## 2023-12-15 NOTE — ED Provider Notes (Signed)
 [facility]    CSN: [id number] Arrival date & time: 12/15/23  9061      History   Chief Complaint No chief complaint on file.   HPI Tyler Mack is a 58 y.o. male.   The history is provided by the patient.   Patient presents with a 3-week history of right sided low back pain.  Patient states symptoms started in the right shoulder, and has since moved down to his right lower back around his kidneys.  Patient denies injury, trauma, hematuria, urinary frequency, urgency, hesitancy, or decreased urine stream.  Patient states that he does do heavy lifting at work, but that something that he always does.  Patient states he has been using lidocaine  patches and ibuprofen for pain.  States pain worsened over the past several days.  Past Medical History:  Diagnosis Date   Allergy    GERD (gastroesophageal reflux disease)    Restless legs syndrome     There are no active problems to display for this patient.   Past Surgical History:  Procedure Laterality Date   CARPAL TUNNEL RELEASE Bilateral 04/08/2022   Procedure: BILATERAL CARPAL TUNNEL RELEASE;  Surgeon: Murrell Drivers, MD;  Location: [facility];  Service: Orthopedics;  Laterality: Bilateral;  60 MIN   GANGLION CYST EXCISION  04/08/2022   Procedure: REMOVAL GANGLION OF WRIST, right;  Surgeon: Murrell Drivers, MD;  Location: [facility];  Service: Orthopedics;;   KNEE ARTHROSCOPY     KNEE SURGERY         Home Medications    Prior to Admission medications   Medication Sig Start Date End Date Taking? Authorizing Provider  betamethasone  dipropionate 0.05 % cream Apply to the affected areas 2 times a day, 2 weeks on and 1 week off, repeat as needed 02/04/21     betamethasone  dipropionate 0.05 % cream Apply 1 Application topically to hands and elbows 2 (two) times daily for 2 weeks on and 2 weeks off. 07/23/22     buPROPion  (WELLBUTRIN  XL) 300 MG 24 hr tablet Take 1 tablet by mouth once a day  04/29/21     buPROPion  (WELLBUTRIN  XL) 300 MG 24 hr tablet Take 1 tablet (300 mg total) by mouth daily. 03/12/23     buPROPion  (WELLBUTRIN  XL) 300 MG 24 hr tablet Take 1 tablet (300 mg total) by mouth daily. 03/12/23     buPROPion  (WELLBUTRIN  XL) 300 MG 24 hr tablet Take 1 tablet (300 mg total) by mouth daily. 11/12/23     cyclobenzaprine  (FLEXERIL ) 10 MG tablet Take 1 tablet (10 mg total) by mouth 2 (two) times daily as needed for muscle spasms. 06/13/19   Avegno, Komlanvi S, FNP  cyclobenzaprine  (FLEXERIL ) 10 MG tablet TAKE 1 TO 2 TABLETS BY MOUTH AT BEDTIME AS NEEDED. 10/03/20     cyclobenzaprine  (FLEXERIL ) 10 MG tablet TAKE 1 TO 2 TABLETS BY MOUTH AT BEDTIME AS NEEDED. 01/14/21     cyclobenzaprine  (FLEXERIL ) 10 MG tablet Take 1 tablet three times a day. 01/29/22     cyclobenzaprine  (FLEXERIL ) 10 MG tablet Take 1 tablet (10 mg total) by mouth 3 (three) times daily. 05/18/23     cyclobenzaprine  (FLEXERIL ) 10 MG tablet Take 1 tablet (10 mg total) by mouth 3 (three) times daily. 08/18/23     tacrolimus  (PROTOPIC ) 0.1 % ointment Apply to affected areas 2 times a day for 2 weeks on and 2 weeks off as directed 04/15/21     tacrolimus  (PROTOPIC ) 0.1 %  ointment Apply 1 Application topically 2 (two) times daily for 2 weeks on and 2 weeks off. 07/23/22     traMADol  (ULTRAM ) 50 MG tablet Take 1 tablet (50 mg total) by mouth every 6 (six) hours as needed. 11/12/23     cetirizine  (ZYRTEC ) 10 MG tablet Take 1 tablet (10 mg total) daily by mouth. 04/28/17 06/13/19  Christopher Savannah, PA-C    Family History Family History  Problem Relation Age of Onset   Diabetes Mother    Stomach cancer Father    Heart disease Brother    Hyperlipidemia Brother    Hypertension Brother    Mental illness Brother    Alcohol abuse Brother    Drug abuse Brother    Asthma Son    Alcohol abuse Maternal Aunt    Cancer Maternal Aunt    Alcohol abuse Maternal Uncle    Cancer Maternal Uncle     Social History Social History   Tobacco Use    Smoking status: Every Day    Current packs/day: 0.50    Types: Cigarettes   Smokeless tobacco: Never  Vaping Use   Vaping status: Never Used  Substance Use Topics   Alcohol use: Yes    Alcohol/week: 2.0 standard drinks of alcohol    Types: 2 Shots of liquor per week    Comment: every other weekend   Drug use: No     Allergies   Penicillins   Review of Systems Review of Systems Per HPI   Physical Exam Triage Vital Signs ED Triage Vitals  Encounter Vitals Group     BP 12/15/23 0953 (!) 141/90     Girls Systolic BP Percentile --      Girls Diastolic BP Percentile --      Boys Systolic BP Percentile --      Boys Diastolic BP Percentile --      Pulse Rate 12/15/23 0953 90     Resp 12/15/23 0953 18     Temp 12/15/23 0953 98.1 F (36.7 C)     Temp Source 12/15/23 0953 Oral     SpO2 12/15/23 0953 98 %     Weight --      Height --      Head Circumference --      Peak Flow --      Pain Score 12/15/23 0954 10     Pain Loc --      Pain Education --      Exclude from Growth Chart --    No data found.  Updated Vital Signs BP (!) 141/90 (BP Location: Right Arm)   Pulse 90   Temp 98.1 F (36.7 C) (Oral)   Resp 18   SpO2 98%   Visual Acuity Right Eye Distance:   Left Eye Distance:   Bilateral Distance:    Right Eye Near:   Left Eye Near:    Bilateral Near:     Physical Exam Vitals and nursing note reviewed.  Constitutional:      General: He is not in acute distress.    Appearance: Normal appearance.  HENT:     Head: Normocephalic.   Eyes:     Extraocular Movements: Extraocular movements intact.     Pupils: Pupils are equal, round, and reactive to light.    Cardiovascular:     Rate and Rhythm: Normal rate and regular rhythm.     Pulses: Normal pulses.     Heart sounds: Normal heart sounds.  Pulmonary:  Effort: Pulmonary effort is normal. No respiratory distress.     Breath sounds: Normal breath sounds. No stridor. No wheezing, rhonchi or rales.   Abdominal:     Palpations: Abdomen is soft.     Tenderness: There is no abdominal tenderness.   Musculoskeletal:     Cervical back: Normal range of motion.     Lumbar back: Tenderness present. No swelling or deformity. Negative right straight leg raise test.       Back:   Skin:    General: Skin is warm and dry.   Neurological:     General: No focal deficit present.     Mental Status: He is alert and oriented to person, place, and time.   Psychiatric:        Mood and Affect: Mood normal.        Behavior: Behavior normal.      UC Treatments / Results  Labs (all labs ordered are listed, but only abnormal results are displayed) Labs Reviewed  POCT URINALYSIS DIP (MANUAL ENTRY) - Abnormal; Notable for the following components:      Result Value   Blood, UA trace-intact (*)    All other components within normal limits    EKG   Radiology No results found.  Procedures Procedures (including critical care time)  Medications Ordered in UC Medications - No data to display  Initial Impression / Assessment and Plan / UC Course  I have reviewed the triage vital signs and the nursing notes.  Pertinent labs & imaging results that were available during my care of the patient were reviewed by me and considered in my medical decision making (see chart for details).  Urinalysis was positive for blood.  Difficult to determine if patient's right sided low back pain is caused from a kidney stone or if this is musculoskeletal.  On exam, he does have moderate tenderness with mild palpation to the affected area.  Will provide treatment for both with tamsulosin 0.4 mg for patient to take to help with urinary flow, and tizanidine 4 mg tablets for back pain and spasm, along with lidocaine  5% patches.  Patient was given an injection of Toradol  30 mg IM in the clinic today.  Supportive care recommendations were provided and discussed with the patient to include fluids, over-the-counter  analgesics, and stretching exercises.  Discussed follow-up precautions with the patient to include ER follow-up precautions..  Patient was in agreement with this plan of care and verbalizes understanding.  All questions were answered.  Patient stable for discharge.  Final Clinical Impressions(s) / UC Diagnoses   Final diagnoses:  None   Discharge Instructions   None    ED Prescriptions   None    PDMP not reviewed this encounter.   Gilmer Etta PARAS, NP 12/15/23 1051

## 2023-12-15 NOTE — ED Triage Notes (Signed)
 Back pain x 3 weeks.  Feels pain on right side more around kidney area.  Has been using lidocaine  patches and ibuprofen for pain.  States pain started at shoulder blade and has moved to mid back.

## 2023-12-15 NOTE — Discharge Instructions (Signed)
 You were given an injection of Toradol  30 mg.  I am treating you for both a possible kidney stone and for low back pain. Take medication as prescribed. I have also provided a strainer for you to use to strain your urine each time you urinate to see if you passed a stone. You may continue over-the-counter Tylenol  or ibuprofen as needed for pain or discomfort. Recommend the use of ice or heat.  Apply ice for pain or swelling, heat for spasm or stiffness.  Apply for 20 minutes, remove for 1 hour, repeat as needed. Make sure you are drinking at least 8-10 8 ounce glasses of water daily. Develop a toileting schedule that will allow you to urinate at least every 2 hours. Perform gentle back stretches and exercises to help with your back pain or discomfort.  Continue performing these exercises at least 2-3 times daily. As discussed, if you develop new urinary symptoms such as blood in your urine, urinary frequency, or other concerns, you may follow-up in this clinic or with your primary care physician for further evaluation. If symptoms fail to improve, I do recommend that you follow-up with your PCP for further evaluation. Follow-up as needed.

## 2023-12-16 DIAGNOSIS — L920 Granuloma annulare: Secondary | ICD-10-CM | POA: Diagnosis not present

## 2023-12-31 ENCOUNTER — Other Ambulatory Visit: Payer: Self-pay

## 2024-02-02 ENCOUNTER — Other Ambulatory Visit: Payer: Self-pay

## 2024-02-02 ENCOUNTER — Other Ambulatory Visit (HOSPITAL_COMMUNITY): Payer: Self-pay

## 2024-02-23 ENCOUNTER — Other Ambulatory Visit (HOSPITAL_COMMUNITY): Payer: Self-pay

## 2024-02-23 ENCOUNTER — Other Ambulatory Visit: Payer: Self-pay

## 2024-02-23 DIAGNOSIS — G894 Chronic pain syndrome: Secondary | ICD-10-CM | POA: Diagnosis not present

## 2024-02-23 MED ORDER — PODOFILOX 0.5 % EX SOLN
CUTANEOUS | 3 refills | Status: AC
  Filled 2024-02-23: qty 3.5, 30d supply, fill #0

## 2024-02-23 MED ORDER — TRAMADOL HCL 50 MG PO TABS
50.0000 mg | ORAL_TABLET | Freq: Four times a day (QID) | ORAL | 2 refills | Status: AC | PRN
  Filled 2024-02-23 – 2024-03-01 (×3): qty 120, 30d supply, fill #0
  Filled 2024-03-30: qty 120, 30d supply, fill #1
  Filled 2024-04-28: qty 120, 30d supply, fill #2

## 2024-02-23 MED ORDER — CYCLOBENZAPRINE HCL 10 MG PO TABS
10.0000 mg | ORAL_TABLET | Freq: Three times a day (TID) | ORAL | 5 refills | Status: AC
  Filled 2024-02-23: qty 60, 20d supply, fill #0

## 2024-02-29 ENCOUNTER — Other Ambulatory Visit (HOSPITAL_COMMUNITY): Payer: Self-pay

## 2024-03-01 ENCOUNTER — Other Ambulatory Visit: Payer: Self-pay

## 2024-03-01 ENCOUNTER — Other Ambulatory Visit (HOSPITAL_COMMUNITY): Payer: Self-pay

## 2024-03-30 ENCOUNTER — Other Ambulatory Visit (HOSPITAL_COMMUNITY): Payer: Self-pay

## 2024-03-31 ENCOUNTER — Other Ambulatory Visit: Payer: Self-pay

## 2024-03-31 ENCOUNTER — Other Ambulatory Visit (HOSPITAL_COMMUNITY): Payer: Self-pay

## 2024-03-31 MED ORDER — BUPROPION HCL ER (XL) 300 MG PO TB24
300.0000 mg | ORAL_TABLET | Freq: Every day | ORAL | 2 refills | Status: AC
  Filled 2024-03-31: qty 90, 90d supply, fill #0

## 2024-04-28 ENCOUNTER — Other Ambulatory Visit: Payer: Self-pay

## 2024-04-28 ENCOUNTER — Other Ambulatory Visit (HOSPITAL_COMMUNITY): Payer: Self-pay

## 2024-05-26 ENCOUNTER — Other Ambulatory Visit (HOSPITAL_COMMUNITY): Payer: Self-pay

## 2024-05-26 DIAGNOSIS — G8929 Other chronic pain: Secondary | ICD-10-CM | POA: Diagnosis not present

## 2024-05-26 DIAGNOSIS — E785 Hyperlipidemia, unspecified: Secondary | ICD-10-CM | POA: Diagnosis not present

## 2024-05-26 DIAGNOSIS — F411 Generalized anxiety disorder: Secondary | ICD-10-CM | POA: Diagnosis not present

## 2024-05-26 DIAGNOSIS — M182 Bilateral post-traumatic osteoarthritis of first carpometacarpal joints: Secondary | ICD-10-CM | POA: Diagnosis not present

## 2024-05-26 MED ORDER — CYCLOBENZAPRINE HCL 10 MG PO TABS
10.0000 mg | ORAL_TABLET | Freq: Two times a day (BID) | ORAL | 1 refills | Status: AC
  Filled 2024-05-26: qty 180, 90d supply, fill #0

## 2024-05-26 MED ORDER — BUPROPION HCL ER (XL) 300 MG PO TB24
300.0000 mg | ORAL_TABLET | Freq: Every morning | ORAL | 1 refills | Status: AC
  Filled 2024-05-26: qty 90, 90d supply, fill #0

## 2024-05-26 MED ORDER — TRAMADOL HCL 50 MG PO TABS
50.0000 mg | ORAL_TABLET | Freq: Four times a day (QID) | ORAL | 2 refills | Status: AC
  Filled 2024-05-26: qty 120, 30d supply, fill #0
  Filled 2024-06-23: qty 120, 30d supply, fill #1
  Filled 2024-07-19: qty 120, 30d supply, fill #2

## 2024-06-13 DIAGNOSIS — M7989 Other specified soft tissue disorders: Secondary | ICD-10-CM | POA: Diagnosis not present

## 2024-06-18 ENCOUNTER — Other Ambulatory Visit: Payer: Self-pay

## 2024-06-23 ENCOUNTER — Other Ambulatory Visit: Payer: Self-pay

## 2024-06-23 ENCOUNTER — Other Ambulatory Visit (HOSPITAL_COMMUNITY): Payer: Self-pay

## 2024-07-19 ENCOUNTER — Other Ambulatory Visit (HOSPITAL_COMMUNITY): Payer: Self-pay
